# Patient Record
Sex: Female | Born: 1950 | Race: White | Hispanic: No | Marital: Married | Smoking: Never smoker
Health system: Southern US, Community
[De-identification: ages and names within clinical notes are randomized; demographics above are authoritative.]

## PROBLEM LIST (undated history)

## (undated) DIAGNOSIS — K648 Other hemorrhoids: Secondary | ICD-10-CM

## (undated) DIAGNOSIS — H269 Unspecified cataract: Secondary | ICD-10-CM

## (undated) DIAGNOSIS — M069 Rheumatoid arthritis, unspecified: Secondary | ICD-10-CM

## (undated) DIAGNOSIS — R768 Other specified abnormal immunological findings in serum: Secondary | ICD-10-CM

## (undated) DIAGNOSIS — R945 Abnormal results of liver function studies: Secondary | ICD-10-CM

## (undated) DIAGNOSIS — D126 Benign neoplasm of colon, unspecified: Secondary | ICD-10-CM

## (undated) DIAGNOSIS — R7989 Other specified abnormal findings of blood chemistry: Secondary | ICD-10-CM

## (undated) HISTORY — DX: Benign neoplasm of colon, unspecified: D12.6

## (undated) HISTORY — DX: Other hemorrhoids: K64.8

## (undated) HISTORY — DX: Abnormal results of liver function studies: R94.5

## (undated) HISTORY — DX: Rheumatoid arthritis, unspecified: M06.9

## (undated) HISTORY — DX: Other specified abnormal findings of blood chemistry: R79.89

## (undated) HISTORY — DX: Unspecified cataract: H26.9

## (undated) HISTORY — DX: Other specified abnormal immunological findings in serum: R76.8

## (undated) HISTORY — PX: LIVER BIOPSY: SHX301

---

## 1999-08-29 ENCOUNTER — Encounter: Payer: Self-pay | Admitting: Unknown Physician Specialty

## 1999-08-29 ENCOUNTER — Encounter: Admission: RE | Admit: 1999-08-29 | Discharge: 1999-08-29 | Payer: Self-pay | Admitting: Unknown Physician Specialty

## 2000-09-17 ENCOUNTER — Encounter: Payer: Self-pay | Admitting: Obstetrics and Gynecology

## 2000-09-17 ENCOUNTER — Encounter: Admission: RE | Admit: 2000-09-17 | Discharge: 2000-09-17 | Payer: Self-pay | Admitting: Obstetrics and Gynecology

## 2001-09-20 ENCOUNTER — Encounter: Payer: Self-pay | Admitting: Obstetrics and Gynecology

## 2001-09-20 ENCOUNTER — Encounter: Admission: RE | Admit: 2001-09-20 | Discharge: 2001-09-20 | Payer: Self-pay | Admitting: Obstetrics and Gynecology

## 2002-11-01 ENCOUNTER — Encounter: Admission: RE | Admit: 2002-11-01 | Discharge: 2002-11-01 | Payer: Self-pay | Admitting: Obstetrics and Gynecology

## 2002-11-01 ENCOUNTER — Encounter: Payer: Self-pay | Admitting: Obstetrics and Gynecology

## 2008-01-20 ENCOUNTER — Ambulatory Visit: Payer: Self-pay | Admitting: Internal Medicine

## 2008-01-20 DIAGNOSIS — M069 Rheumatoid arthritis, unspecified: Secondary | ICD-10-CM | POA: Insufficient documentation

## 2008-01-20 DIAGNOSIS — J189 Pneumonia, unspecified organism: Secondary | ICD-10-CM | POA: Insufficient documentation

## 2008-02-08 ENCOUNTER — Ambulatory Visit: Payer: Self-pay | Admitting: Internal Medicine

## 2008-02-09 ENCOUNTER — Telehealth: Payer: Self-pay | Admitting: Internal Medicine

## 2018-02-02 ENCOUNTER — Encounter: Payer: Self-pay | Admitting: Gastroenterology

## 2018-02-02 ENCOUNTER — Encounter: Payer: Self-pay | Admitting: Internal Medicine

## 2018-02-10 ENCOUNTER — Ambulatory Visit: Payer: Self-pay | Admitting: Gastroenterology

## 2018-02-17 ENCOUNTER — Encounter: Payer: Self-pay | Admitting: Internal Medicine

## 2018-02-18 ENCOUNTER — Telehealth: Payer: Self-pay | Admitting: Internal Medicine

## 2018-02-18 DIAGNOSIS — R945 Abnormal results of liver function studies: Principal | ICD-10-CM

## 2018-02-18 DIAGNOSIS — R7989 Other specified abnormal findings of blood chemistry: Secondary | ICD-10-CM

## 2018-02-18 NOTE — Telephone Encounter (Signed)
Please see note below. Did you get this pts labs last night? Do you want to see her sooner or schedule her with an app? Please advise.

## 2018-02-21 NOTE — Telephone Encounter (Signed)
Pt with elevated liver enzymes recently.  Last labs 02/15/2018 AST elevated as of 1231 at 72, ALT 130, alkaline phosphatase 150 Previously in May 2019 AST was 28, ALT 30, alkaline phosphatase 89  Please have patient come for the following labs and imaging --CBC, hepatic function panel, INR, basic metabolic panel, ANA, IgG, anti-smooth muscle antibody, antimitochondrial antibody, acute viral hepatitis panel, hepatitis B core total antibody, GGT, ferritin, IBC panel --Please arrange for a complete abdominal ultrasound if not yet performed --Patient also needs to be scheduled to see me in the clinic this week or next

## 2018-02-21 NOTE — Telephone Encounter (Signed)
Patient has already had ultrasound completed at [facility] and has had acute viral studies as well as a CMP, therefore we will not repeat ultrasound or viral hep studies. Dr Hilarie Fredrickson indicates that we can repeat hepatic function test and other appropriate labs minus viral hep studies. Adjustments have been made to orders and patient advised. She will come for labs this week. Patient will come for office visit on 02/28/2018 at 945 am.

## 2018-02-22 ENCOUNTER — Other Ambulatory Visit (INDEPENDENT_AMBULATORY_CARE_PROVIDER_SITE_OTHER): Payer: Self-pay

## 2018-02-22 ENCOUNTER — Encounter (INDEPENDENT_AMBULATORY_CARE_PROVIDER_SITE_OTHER): Payer: Self-pay

## 2018-02-22 DIAGNOSIS — R945 Abnormal results of liver function studies: Secondary | ICD-10-CM

## 2018-02-22 DIAGNOSIS — R7989 Other specified abnormal findings of blood chemistry: Secondary | ICD-10-CM

## 2018-02-22 LAB — CBC WITH DIFFERENTIAL/PLATELET
Basophils Absolute: 0 10*3/uL (ref 0.0–0.1)
Basophils Relative: 0.6 % (ref 0.0–3.0)
EOS PCT: 2.2 % (ref 0.0–5.0)
Eosinophils Absolute: 0.1 10*3/uL (ref 0.0–0.7)
HEMATOCRIT: 36.4 % (ref 36.0–46.0)
Hemoglobin: 12.3 g/dL (ref 12.0–15.0)
Lymphocytes Relative: 46.5 % — ABNORMAL HIGH (ref 12.0–46.0)
Lymphs Abs: 2.9 10*3/uL (ref 0.7–4.0)
MCHC: 33.8 g/dL (ref 30.0–36.0)
MCV: 95.3 fl (ref 78.0–100.0)
Monocytes Absolute: 0.5 10*3/uL (ref 0.1–1.0)
Monocytes Relative: 7.9 % (ref 3.0–12.0)
Neutro Abs: 2.7 10*3/uL (ref 1.4–7.7)
Neutrophils Relative %: 42.8 % — ABNORMAL LOW (ref 43.0–77.0)
Platelets: 249 10*3/uL (ref 150.0–400.0)
RBC: 3.82 Mil/uL — AB (ref 3.87–5.11)
RDW: 12.4 % (ref 11.5–15.5)
WBC: 6.3 10*3/uL (ref 4.0–10.5)

## 2018-02-22 LAB — IBC PANEL
Iron: 112 ug/dL (ref 42–145)
Saturation Ratios: 39.6 % (ref 20.0–50.0)
Transferrin: 202 mg/dL — ABNORMAL LOW (ref 212.0–360.0)

## 2018-02-22 LAB — PROTIME-INR
INR: 0.9 ratio (ref 0.8–1.0)
Prothrombin Time: 11.1 s (ref 9.6–13.1)

## 2018-02-22 LAB — HEPATIC FUNCTION PANEL
ALT: 109 U/L — ABNORMAL HIGH (ref 0–35)
AST: 84 U/L — ABNORMAL HIGH (ref 0–37)
Albumin: 4.3 g/dL (ref 3.5–5.2)
Alkaline Phosphatase: 142 U/L — ABNORMAL HIGH (ref 39–117)
Bilirubin, Direct: 0.1 mg/dL (ref 0.0–0.3)
Total Bilirubin: 0.5 mg/dL (ref 0.2–1.2)
Total Protein: 6.7 g/dL (ref 6.0–8.3)

## 2018-02-22 LAB — FERRITIN: FERRITIN: 107.6 ng/mL (ref 10.0–291.0)

## 2018-02-22 LAB — GAMMA GT: GGT: 176 U/L — ABNORMAL HIGH (ref 7–51)

## 2018-02-23 ENCOUNTER — Encounter: Payer: Self-pay | Admitting: *Deleted

## 2018-02-25 LAB — HEPATITIS B SURFACE ANTIBODY,QUALITATIVE: Hep B S Ab: NONREACTIVE

## 2018-02-25 LAB — ANTI-SMOOTH MUSCLE ANTIBODY, IGG: Actin (Smooth Muscle) Antibody (IGG): <20 U (ref <20)

## 2018-02-25 LAB — ANA: Anti Nuclear Antibody(ANA): NEGATIVE

## 2018-02-25 LAB — IGG: IgG (Immunoglobin G), Serum: 815 mg/dL (ref 600–1540)

## 2018-02-25 LAB — HEPATITIS A ANTIBODY, TOTAL: Hepatitis A AB,Total: NONREACTIVE

## 2018-02-25 LAB — MITOCHONDRIAL ANTIBODIES: Mitochondrial M2 Ab, IgG: <=20 U

## 2018-02-28 ENCOUNTER — Encounter: Payer: Self-pay | Admitting: Internal Medicine

## 2018-02-28 ENCOUNTER — Ambulatory Visit: Payer: Medicare Other | Admitting: Internal Medicine

## 2018-02-28 ENCOUNTER — Other Ambulatory Visit (INDEPENDENT_AMBULATORY_CARE_PROVIDER_SITE_OTHER): Payer: Medicare Other

## 2018-02-28 VITALS — BP 100/60 | HR 68 | Ht 64.0 in | Wt 141.0 lb

## 2018-02-28 DIAGNOSIS — R945 Abnormal results of liver function studies: Secondary | ICD-10-CM | POA: Diagnosis not present

## 2018-02-28 DIAGNOSIS — R7989 Other specified abnormal findings of blood chemistry: Secondary | ICD-10-CM

## 2018-02-28 LAB — IGA: IgA: 261 mg/dL (ref 68–378)

## 2018-02-28 LAB — CK: Total CK: 68 U/L (ref 7–177)

## 2018-02-28 LAB — CORTISOL: Cortisol, Plasma: 7.9 ug/dL

## 2018-02-28 MED ORDER — CLOTRIMAZOLE-BETAMETHASONE 1-0.05 % EX CREA: 1.0000 "application " | TOPICAL_CREAM | Freq: Two times a day (BID) | CUTANEOUS | 0 refills | Status: AC

## 2018-02-28 NOTE — Progress Notes (Signed)
Patient ID: ALEXSYS ESKIN, female   DOB: 1950/12/12, 68 y.o.   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 Amil Amen and Dr. Lin Landsman to evaluate elevated liver enzymes.  She is here alone today.  She reports she is feeling great, but is worried about her elevated liver enzymes.  She has had no GI symptoms including nausea, vomiting, heartburn, trouble swallowing.  No abdominal pain.  No change in bowel habits.  No diarrhea or constipation.  No jaundice, itching.  No abdominal or lower extremity swelling.  There is been no change in medicine.  She has been taking Orencia for 6 to 8 years.  She was previously taking a multivitamin but has stopped this.  No other medicines, over-the-counter supplements or herbals.  She took Remicade many years ago but stopped after she developed pneumonia.  In May 2019 her liver enzymes were normal and the initial elevated liver enzymes were from October 2019.  They have been persistently elevated when rechecked in November, December and January 2020.    She does not use tobacco or drink alcohol.  She did previously drink alcohol on occasion but stopped when her liver enzymes were elevated.  No family history of liver disease.  Incidental history she provides included a scaly red rash on her neck which occurred in the summer 2019.  This resolved.  She will occasionally get flaky irritation above her eyes which is treated with a cream.  She also developed plantar fasciitis over the summer but this is also improved with shoe inserts.  She remains very active walking for 5 miles daily and playing tennis frequently.  She had a colonoscopy in June 2016 in [place] where a small sessile serrated polyp was removed.  She had an ultrasound of the right upper quadrant performed in [place] on 01/24/2018.  This was normal.  Gallbladder was  normal.  Bile duct was 3 mm.  Liver was normal in echotexture and Doppler blood flow imaging was normal.  Past Medical History:  Diagnosis Date  . Elevated LFTs   . Internal hemorrhoids   . Rheumatoid arthritis ([place])   . Rheumatoid factor positive   . Serrated adenoma of colon     History reviewed. No pertinent surgical history.  Outpatient Medications Prior to Visit  Medication Sig Dispense Refill  . abatacept (ORENCIA) 250 MG injection Inject into the vein every 30 (thirty) days.    . Multiple Vitamin (MULTIVITAMIN) capsule Take by mouth.     No facility-administered medications prior to visit.     Allergies  Allergen Reactions  . Codeine     REACTION: GI upset    Family History  Problem Relation Age of Onset  . Polymyalgia rheumatica Mother   . Osteoarthritis Mother   . Heart disease Father   . Cancer Maternal Grandfather     Social History   Tobacco Use  . Smoking status: Never Smoker  . Smokeless tobacco: Never Used  Substance Use Topics  . Alcohol use: Yes    Comment: rare  . Drug use: Never    ROS: As per history of present illness, otherwise negative  BP 100/60   Pulse 68   Ht 5\' 4"  (1.626 m)   Wt 141 lb (64 kg)   BMI 24.20 kg/m  Constitutional: Well-developed and well-nourished. No distress. HEENT: Normocephalic and atraumatic.  Conjunctivae are normal.  No scleral icterus. Neck: Neck supple. Trachea midline. Cardiovascular: Normal rate, regular rhythm and intact distal pulses. No M/R/G Pulmonary/chest: Effort normal and breath sounds normal. No wheezing, rales or rhonchi. Abdominal: Soft, nontender, nondistended. Bowel sounds active throughout. There are no masses palpable. No hepatosplenomegaly. Extremities: no clubbing, cyanosis, or edema Neurological: Alert and oriented to person place and time. Skin: Skin is warm and dry.  There is a red rash at her bra line under her right breast which appears fungal in nature Psychiatric: Normal mood and  affect. Behavior is normal.  RELEVANT LABS AND IMAGING: CBC    Component Value Date/Time   WBC 6.3 02/22/2018 1116   RBC 3.82 (L) 02/22/2018 1116   HGB 12.3 02/22/2018 1116   HCT 36.4 02/22/2018 1116   PLT 249.0 02/22/2018 1116   MCV 95.3 02/22/2018 1116   MCHC 33.8 02/22/2018 1116   RDW 12.4 02/22/2018 1116   LYMPHSABS 2.9 02/22/2018 1116   MONOABS 0.5 02/22/2018 1116   EOSABS 0.1 02/22/2018 1116   BASOSABS 0.0 02/22/2018 1116    CMP     Component Value Date/Time   PROT 6.7 02/22/2018 1116   ALBUMIN 4.3 02/22/2018 1116   AST 84 (H) 02/22/2018 1116   ALT 109 (H) 02/22/2018 1116   ALKPHOS 142 (H) 02/22/2018 1116   BILITOT 0.5 02/22/2018 1116   [place] -- see HPI  Acute viral panel negative, hepatitis C antibody negative  Labs ordered by me before this visit Iron normal IgG normal ANA negative INR 0.9 GGT elevated at 176 Hepatitis B surface antibody negative Hepatitis A negative Antimitochondrial antibody negative Anti-smooth muscle antibody negative   ASSESSMENT/PLAN: 68 year old female with PMH of 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 Amil Amen and Dr. Lin Landsman to evaluate elevated liver enzymes.   1. Elevated liver enzymes --I have reviewed her labs and imaging with her today.  There is absolutely no evidence of liver failure or advanced liver disease.  Her liver ultrasound was also reassuring.  Initially, I was suspicious about a possible autoimmune overlap given her history of rheumatoid arthritis but her autoimmune markers to this point have been completely negative.  Medication related liver enzyme abnormality was considered but Orencia does not have elevated liver enzymes listed as a possible complication, she is also been on this medication for many years without issue to this point.  I have recommended some additional test and imaging as follows --Hepatitis B surface  antigen --Anti-liver kidney microsomal antibody --Fractionate alkaline phosphatase though this is likely liver in origin --Ceruloplasmin --Celiac panel --Cortisol --Creatinine kinase --Proceed with MRI of the abdomen with contrast plus MRCP  If the above work-up is also unrevealing I would recommend liver biopsy.  We discussed this today.  If after liver biopsy we do not have a clear diagnosis I would consider sending her to Dr. Charlean Sanfilippo for his opinion.  2.  History of sessile serrated polyp --surveillance colonoscopy June 2021  Cc: Dr. Lovette Cliche and Dr. Leigh Aurora

## 2018-02-28 NOTE — Patient Instructions (Signed)
If you are age 70 or older, your body mass index should be between 23-30. Your Body mass index is 24.2 kg/m. If this is out of the aforementioned range listed, please consider follow up with your Primary Care Provider.  If you are age 47 or younger, your body mass index should be between 19-25. Your Body mass index is 24.2 kg/m. If this is out of the aformentioned range listed, please consider follow up with your Primary Care Provider.   You have been scheduled for an MRI/MRCP at [facility] 03/04/18. Your appointment time is 3:00pm. Please arrive 30 minutes prior to your appointment time for registration purposes. Please make certain not to have anything to eat or drink 6 hours prior to your test. In addition, if you have any metal in your body, have a pacemaker or defibrillator, please be sure to let your ordering physician know. This test typically takes 45 minutes to 1 hour to complete. Should you need to reschedule, please call [phone] to do so.  Your provider has requested that you go to the basement level for lab work before leaving today. Press "B" on the elevator. The lab is located at the first door on the left as you exit the elevator.

## 2018-03-03 LAB — TISSUE TRANSGLUTAMINASE, IGA: (tTG) Ab, IgA: 1 U/mL

## 2018-03-03 LAB — ALKALINE PHOSPHATASE: Alkaline phosphatase (APISO): 163 U/L — ABNORMAL HIGH (ref 33–130)

## 2018-03-03 LAB — HEPATITIS B CORE ANTIBODY, TOTAL: Hep B Core Total Ab: NONREACTIVE

## 2018-03-03 LAB — HEPATITIS B SURFACE ANTIGEN: Hepatitis B Surface Ag: NONREACTIVE

## 2018-03-03 LAB — ALPHA-1-ANTITRYPSIN: A-1 Antitrypsin, Ser: 138 mg/dL (ref 83–199)

## 2018-03-03 LAB — ANTI-MICROSOMAL ANTIBODY LIVER / KIDNEY: LKM1 Ab: <=20 U (ref <=20.0)

## 2018-03-03 LAB — CERULOPLASMIN: Ceruloplasmin: 30 mg/dL (ref 18–53)

## 2018-03-04 ENCOUNTER — Other Ambulatory Visit: Payer: Self-pay | Admitting: Internal Medicine

## 2018-03-04 ENCOUNTER — Telehealth: Payer: Self-pay | Admitting: Internal Medicine

## 2018-03-04 ENCOUNTER — Ambulatory Visit (HOSPITAL_COMMUNITY)
Admission: RE | Admit: 2018-03-04 | Discharge: 2018-03-04 | Disposition: A | Discharge status: Home / Self Care | Payer: Medicare Other | Source: Ambulatory Visit | Attending: Internal Medicine | Admitting: Internal Medicine

## 2018-03-04 DIAGNOSIS — R945 Abnormal results of liver function studies: Principal | ICD-10-CM

## 2018-03-04 DIAGNOSIS — R7989 Other specified abnormal findings of blood chemistry: Secondary | ICD-10-CM

## 2018-03-04 LAB — CREATININE, SERUM
Creatinine, Ser: 0.96 mg/dL (ref 0.44–1.00)
GFR calc Af Amer: >60 mL/min (ref >60)
GFR calc non Af Amer: >60 mL/min (ref >60)

## 2018-03-04 MED ORDER — GADOBUTROL 1 MMOL/ML IV SOLN
6.0000 mL | Freq: Once | INTRAVENOUS | Status: AC | PRN
  Administered 2018-03-04: 6 mL via INTRAVENOUS

## 2018-03-04 NOTE — Telephone Encounter (Signed)
Correct order is in epic.

## 2018-03-04 NOTE — Telephone Encounter (Signed)
Darlene Martin from [facility] imaging calling, pt has appt this afternoon at 3pm, orders sent for MRI abd and MRCP needs to be w/w.o contrast, code [id number] [id number]. Order sent was without contrast only. Pls fax new order.

## 2018-03-08 ENCOUNTER — Ambulatory Visit: Payer: Self-pay | Admitting: Internal Medicine

## 2018-03-08 ENCOUNTER — Telehealth: Payer: Self-pay | Admitting: *Deleted

## 2018-03-08 DIAGNOSIS — R748 Abnormal levels of other serum enzymes: Secondary | ICD-10-CM

## 2018-03-08 NOTE — Telephone Encounter (Signed)
Orders placed for liver biopsy. Carrie in radiology scheduling indicates that they will make sure this gets scheduled.

## 2018-03-08 NOTE — Telephone Encounter (Signed)
-----   Message from Jerene Bears, MD sent at 03/07/2018  5:14 PM EST ----- Regarding: Schedule appt Please refer patient for ultrasound guided liver biopsy with radiology  Indication is elevated liver enzymes --rule out autoimmune hepatitis  Thanks JMP

## 2018-03-17 ENCOUNTER — Other Ambulatory Visit: Payer: Self-pay | Admitting: Student

## 2018-03-18 ENCOUNTER — Ambulatory Visit (HOSPITAL_COMMUNITY)
Admission: RE | Admit: 2018-03-18 | Discharge: 2018-03-18 | Disposition: A | Discharge status: Home / Self Care | Payer: Medicare Other | Source: Ambulatory Visit | Attending: Internal Medicine | Admitting: Internal Medicine

## 2018-03-18 ENCOUNTER — Encounter (HOSPITAL_COMMUNITY): Payer: Self-pay

## 2018-03-18 DIAGNOSIS — Z8249 Family history of ischemic heart disease and other diseases of the circulatory system: Secondary | ICD-10-CM | POA: Diagnosis not present

## 2018-03-18 DIAGNOSIS — M059 Rheumatoid arthritis with rheumatoid factor, unspecified: Secondary | ICD-10-CM | POA: Diagnosis not present

## 2018-03-18 DIAGNOSIS — Z885 Allergy status to narcotic agent status: Secondary | ICD-10-CM | POA: Diagnosis not present

## 2018-03-18 DIAGNOSIS — R7989 Other specified abnormal findings of blood chemistry: Secondary | ICD-10-CM | POA: Insufficient documentation

## 2018-03-18 DIAGNOSIS — R748 Abnormal levels of other serum enzymes: Secondary | ICD-10-CM | POA: Insufficient documentation

## 2018-03-18 DIAGNOSIS — Z8261 Family history of arthritis: Secondary | ICD-10-CM | POA: Diagnosis not present

## 2018-03-18 DIAGNOSIS — Z79899 Other long term (current) drug therapy: Secondary | ICD-10-CM | POA: Insufficient documentation

## 2018-03-18 LAB — PROTIME-INR
INR: 0.85
PROTHROMBIN TIME: 11.5 s (ref 11.4–15.2)

## 2018-03-18 LAB — CBC
HCT: 40 % (ref 36.0–46.0)
Hemoglobin: 12.7 g/dL (ref 12.0–15.0)
MCH: 31.2 pg (ref 26.0–34.0)
MCHC: 31.8 g/dL (ref 30.0–36.0)
MCV: 98.3 fL (ref 80.0–100.0)
Platelets: 278 10*3/uL (ref 150–400)
RBC: 4.07 MIL/uL (ref 3.87–5.11)
RDW: 12.3 % (ref 11.5–15.5)
WBC: 7.3 10*3/uL (ref 4.0–10.5)
nRBC: 0 % (ref 0.0–0.2)

## 2018-03-18 LAB — APTT: aPTT: 27 seconds (ref 24–36)

## 2018-03-18 MED ORDER — LIDOCAINE HCL 1 % IJ SOLN
INTRAMUSCULAR | Status: AC | PRN
  Administered 2018-03-18: 10 mL

## 2018-03-18 MED ORDER — LIDOCAINE HCL 1 % IJ SOLN
INTRAMUSCULAR | Status: AC
  Filled 2018-03-18: qty 20

## 2018-03-18 MED ORDER — FENTANYL CITRATE (PF) 100 MCG/2ML IJ SOLN
INTRAMUSCULAR | Status: AC | PRN
  Administered 2018-03-18 (×2): 50 ug via INTRAVENOUS

## 2018-03-18 MED ORDER — MIDAZOLAM HCL 2 MG/2ML IJ SOLN
INTRAMUSCULAR | Status: AC | PRN
  Administered 2018-03-18 (×3): 1 mg via INTRAVENOUS

## 2018-03-18 MED ORDER — FENTANYL CITRATE (PF) 100 MCG/2ML IJ SOLN
INTRAMUSCULAR | Status: AC
  Filled 2018-03-18: qty 2

## 2018-03-18 MED ORDER — SODIUM CHLORIDE 0.9 % IV SOLN
INTRAVENOUS | Status: DC
  Administered 2018-03-18: 12:00:00 via INTRAVENOUS

## 2018-03-18 MED ORDER — GELATIN ABSORBABLE 12-7 MM EX MISC
CUTANEOUS | Status: AC
  Filled 2018-03-18: qty 1

## 2018-03-18 MED ORDER — MIDAZOLAM HCL 2 MG/2ML IJ SOLN
INTRAMUSCULAR | Status: AC
  Filled 2018-03-18: qty 4

## 2018-03-18 NOTE — Discharge Instructions (Signed)
Liver Biopsy, Care After °These instructions give you information on caring for yourself after your procedure. Your doctor may also give you more specific instructions. Call your doctor if you have any problems or questions after your procedure. °What can I expect after the procedure? °After the procedure, it is common to have: °· Pain and soreness where the biopsy was done. °· Bruising around the area where the biopsy was done. °· Sleepiness and be tired for a few days. °Follow these instructions at home: °Medicines °· Take over-the-counter and prescription medicines only as told by your doctor. °· If you were prescribed an antibiotic medicine, take it as told by your doctor. Do not stop taking the antibiotic even if you start to feel better. °· Do not take medicines such as aspirin and ibuprofen. These medicines can thin your blood. Do not take these medicines unless your doctor tells you to take them. °· If you are taking prescription pain medicine, take actions to prevent or treat constipation. Your doctor may recommend that you: °? Drink enough fluid to keep your pee (urine) clear or pale yellow. °? Take over-the-counter or prescription medicines. °? Eat foods that are high in fiber, such as fresh fruits and vegetables, whole grains, and beans. °? Limit foods that are high in fat and processed sugars, such as fried and sweet foods. °Caring for your cut °· Follow instructions from your doctor about how to take care of your cuts from surgery (incisions). Make sure you: °? Wash your hands with soap and water before you change your bandage (dressing). If you cannot use soap and water, use hand sanitizer. °? Change your bandage as told by your doctor. °? Leave stitches (sutures), skin glue, or skin tape (adhesive) strips in place. They may need to stay in place for 2 weeks or longer. If tape strips get loose and curl up, you may trim the loose edges. Do not remove tape strips completely unless your doctor says it is  okay. °· Check your cuts every day for signs of infection. Check for: °? Redness, swelling, or more pain. °? Fluid or blood. °? Pus or a bad smell. °? Warmth. °· Do not take baths, swim, or use a hot tub until your doctor says it is okay to do so. °Activity ° °· Rest at home for 1-2 days or as told by your doctor. °? Avoid sitting for a long time without moving. Get up to take short walks every 1-2 hours. °· Return to your normal activities as told by your doctor. Ask what activities are safe for you. °· Do not do these things in the first 24 hours: °? Drive. °? Use machinery. °? Take a bath or shower. °· Do not lift more than 10 pounds (4.5 kg) or play contact sports for the first 2 weeks. °General instructions ° °· Do not drink alcohol in the first week after the procedure. °· Have someone stay with you for at least 24 hours after the procedure. °· Get your test results. Ask your doctor or the department that is doing the test: °? When will my results be ready? °? How will I get my results? °? What are my treatment options? °? What other tests do I need? °? What are my next steps? °· Keep all follow-up visits as told by your doctor. This is important. °Contact a doctor if: °· A cut bleeds and leaves more than just a small spot of blood. °· A cut is red, puffs up (  swells), or hurts more than before. °· Fluid or something else comes from a cut. °· A cut smells bad. °· You have a fever or chills. °Get help right away if: °· You have swelling, bloating, or pain in your belly (abdomen). °· You get dizzy or faint. °· You have a rash. °· You feel sick to your stomach (nauseous) or throw up (vomit). °· You have trouble breathing, feel short of breath, or feel faint. °· Your chest hurts. °· You have problems talking or seeing. °· You have trouble with your balance or moving your arms or legs. °Summary °· After the procedure, it is common to have pain, soreness, bruising, and tiredness. °· Your doctor will tell you how to  take care of yourself at home. Change your bandage, take your medicines, and limit your activities as told by your doctor. °· Call your doctor if you have symptoms of infection. Get help right away if your belly swells, your cut bleeds a lot, or you have trouble talking or breathing. °This information is not intended to replace advice given to you by your health care provider. Make sure you discuss any questions you have with your health care provider. °Document Released: 11/12/2007 Document Revised: 02/12/2017 Document Reviewed: 02/12/2017 °Elsevier Interactive Patient Education © 2019 [employer] °Moderate Conscious Sedation, Adult, Care After °These instructions provide you with information about caring for yourself after your procedure. Your health care provider may also give you more specific instructions. Your treatment has been planned according to current medical practices, but problems sometimes occur. Call your health care provider if you have any problems or questions after your procedure. °What can I expect after the procedure? °After your procedure, it is common: °· To feel sleepy for several hours. °· To feel clumsy and have poor balance for several hours. °· To have poor judgment for several hours. °· To vomit if you eat too soon. °Follow these instructions at home: °For at least 24 hours after the procedure: ° °· Do not: °? Participate in activities where you could fall or become injured. °? Drive. °? Use heavy machinery. °? Drink alcohol. °? Take sleeping pills or medicines that cause drowsiness. °? Make important decisions or sign legal documents. °? Take care of children on your own. °· Rest. °Eating and drinking °· Follow the diet recommended by your health care provider. °· If you vomit: °? Drink water, juice, or soup when you can drink without vomiting. °? Make sure you have little or no nausea before eating solid foods. °General instructions °· Have a responsible adult stay with you until  you are awake and alert. °· Take over-the-counter and prescription medicines only as told by your health care provider. °· If you smoke, do not smoke without supervision. °· Keep all follow-up visits as told by your health care provider. This is important. °Contact a health care provider if: °· You keep feeling nauseous or you keep vomiting. °· You feel light-headed. °· You develop a rash. °· You have a fever. °Get help right away if: °· You have trouble breathing. °This information is not intended to replace advice given to you by your health care provider. Make sure you discuss any questions you have with your health care provider. °Document Released: 11/23/2012 Document Revised: 07/08/2015 Document Reviewed: 05/25/2015 °Elsevier Interactive Patient Education © 2019 Elsevier Inc. ° °

## 2018-03-18 NOTE — Procedures (Signed)
Interventional Radiology Procedure:   Indications: Elevated liver enzymes  Procedure: US guided liver biopsy  Findings: 3 cores from right lobe  Complications: None     EBL: Minimal  Plan: Bedrest 3 hours.    Sylvestre Rathgeber R. Anselm Pancoast, MD  Pager: [phone]

## 2018-03-18 NOTE — H&P (Signed)
Chief Complaint: Patient was seen in consultation today for random liver biopsy at the request of Pyrtle,Jay M  Referring Physician(s): Pyrtle,Jay M  Supervising Physician: Markus Daft  Patient Status: [facility] - Out-pt  History of Present Illness: Darlene Martin is a 68 y.o. female being worked up for elevated liver enzymes. She is referred for random liver biopsy. PMHx, meds, labs, imaging, allergies reviewed. Feels well, no recent fevers, chills, illness. Has been NPO today as directed. Family at bedside.   Past Medical History:  Diagnosis Date  . Elevated LFTs   . Internal hemorrhoids   . Rheumatoid arthritis ([place])   . Rheumatoid factor positive   . Serrated adenoma of colon     History reviewed. No pertinent surgical history.  Allergies: Codeine  Medications: Prior to Admission medications   Medication Sig Start Date End Date Taking? Authorizing Provider  clotrimazole-betamethasone (LOTRISONE) cream Apply 1 application topically 2 (two) times daily. To affected area 02/28/18  Yes Pyrtle, Lajuan Lines, MD  abatacept (ORENCIA) 250 MG injection Inject into the vein every 30 (thirty) days.    [provider]  Multiple Vitamin (MULTIVITAMIN) capsule Take by mouth.    [provider]     Family History  Problem Relation Age of Onset  . Polymyalgia rheumatica Mother   . Osteoarthritis Mother   . Heart disease Father   . Cancer Maternal Grandfather     Social History   Socioeconomic History  . Marital status: Married    Spouse name: Not on file  . Number of children: Not on file  . Years of education: Not on file  . Highest education level: Not on file  Occupational History  . Occupation: retired    Comment: Pharmacist, hospital  Social Needs  . Financial resource strain: Not on file  . Food insecurity:    Worry: Not on file    Inability: Not on file  . Transportation needs:    Medical: Not on file    Non-medical: Not on file  Tobacco Use  . Smoking  status: Never Smoker  . Smokeless tobacco: Never Used  Substance and Sexual Activity  . Alcohol use: Yes    Comment: rare  . Drug use: Never  . Sexual activity: Not on file  Lifestyle  . Physical activity:    Days per week: Not on file    Minutes per session: Not on file  . Stress: Not on file  Relationships  . Social connections:    Talks on phone: Not on file    Gets together: Not on file    Attends religious service: Not on file    Active member of club or organization: Not on file    Attends meetings of clubs or organizations: Not on file    Relationship status: Not on file  Other Topics Concern  . Not on file  Social History Narrative  . Not on file     Review of Systems: A 12 point ROS discussed and pertinent positives are indicated in the HPI above.  All other systems are negative.  Review of Systems  Vital Signs: BP 119/75 (BP Location: Right Arm)   Pulse 70   Temp 98.2 F (36.8 C) (Oral)   Resp 16   Ht 5\' 4"  (1.626 m)   Wt 64 kg   SpO2 98%   BMI 24.20 kg/m   Physical Exam Constitutional:      Appearance: Normal appearance.  HENT:     Mouth/Throat:  Mouth: Mucous membranes are moist.     Pharynx: Oropharynx is clear.  Cardiovascular:     Rate and Rhythm: Normal rate and regular rhythm.     Heart sounds: Normal heart sounds.  Pulmonary:     Effort: Pulmonary effort is normal. No respiratory distress.     Breath sounds: Normal breath sounds.  Skin:    General: Skin is warm and dry.  Neurological:     General: No focal deficit present.     Mental Status: She is alert and oriented to person, place, and time.  Psychiatric:        Mood and Affect: Mood normal.        Judgment: Judgment normal.     Imaging: Mr Abdomen Mrcp W Wo Contast  Result Date: 03/04/2018 CLINICAL DATA:  Elevated liver enzymes. EXAM: MRI ABDOMEN WITHOUT AND WITH CONTRAST (INCLUDING MRCP) TECHNIQUE: Multiplanar multisequence MR imaging of the abdomen was performed both  before and after the administration of intravenous contrast. Heavily T2-weighted images of the biliary and pancreatic ducts were obtained, and three-dimensional MRCP images were rendered by post processing. CONTRAST:  6 mL Gadavist COMPARISON:  Ultrasound 01/25/2018 FINDINGS: Lower chest:  Lung bases clear Hepatobiliary: Normal liver parenchymal intensity. No hepatic steatosis. No focal hepatic lesion. No intrahepatic biliary duct dilatation. Gallbladder is normal. Common bile duct normal. Postcontrast imaging demonstrates normal enhancement of liver parenchyma. No focal lesion. Pancreas: Normal pancreatic parenchymal intensity. No ductal dilatation or inflammation. Spleen: Normal spleen. Adrenals/urinary tract: Adrenal glands and kidneys are normal. Stomach/Bowel: Stomach and limited of the small bowel is unremarkable Vascular/Lymphatic: Abdominal aortic normal caliber. No retroperitoneal periportal lymphadenopathy. Musculoskeletal: No aggressive osseous lesion IMPRESSION: 1. Normal liver parenchyma. 2. Normal biliary tree. 3. Normal pancreas. Electronically Signed   By: Suzy Bouchard M.D.   On: 03/04/2018 17:17    Labs:  CBC: Recent Labs    02/22/18 1116 03/18/18 1131  WBC 6.3 7.3  HGB 12.3 12.7  HCT 36.4 40.0  PLT 249.0 278    COAGS: Recent Labs    02/22/18 1116 03/18/18 1131  INR 0.9 0.85  APTT  --  27    BMP: Recent Labs    03/04/18 1523  CREATININE 0.96  GFRNONAA >60  GFRAA >60    LIVER FUNCTION TESTS: Recent Labs    02/22/18 1116  BILITOT 0.5  AST 84*  ALT 109*  ALKPHOS 142*  PROT 6.7  ALBUMIN 4.3    TUMOR MARKERS: No results for input(s): AFPTM, CEA, CA199, CHROMGRNA in the last 8760 hours.  Assessment and Plan: Elevated LFTs. For US guided random liver bx Labs ok Risks and benefits discussed with the patient including, but not limited to bleeding, infection, damage to adjacent structures or low yield requiring additional tests.  All of the patient's  questions were answered, patient is agreeable to proceed. Consent signed and in chart.    Thank you for this interesting consult.  I greatly enjoyed meeting Darlene Martin and look forward to participating in their care.  A copy of this report was sent to the requesting provider on this date.  Electronically Signed: Ascencion Dike, PA-C 03/18/2018, 12:05 PM   I spent a total of 20 minutes in face to face in clinical consultation, greater than 50% of which was counseling/coordinating care for liver bx

## 2018-03-22 ENCOUNTER — Other Ambulatory Visit: Payer: Self-pay

## 2018-03-22 DIAGNOSIS — R7989 Other specified abnormal findings of blood chemistry: Secondary | ICD-10-CM

## 2018-03-22 DIAGNOSIS — R945 Abnormal results of liver function studies: Principal | ICD-10-CM

## 2018-05-11 ENCOUNTER — Encounter: Payer: Self-pay | Admitting: Internal Medicine

## 2019-07-20 DIAGNOSIS — M0589 Other rheumatoid arthritis with rheumatoid factor of multiple sites: Secondary | ICD-10-CM | POA: Diagnosis not present

## 2019-08-02 DIAGNOSIS — Z6823 Body mass index (BMI) 23.0-23.9, adult: Secondary | ICD-10-CM | POA: Diagnosis not present

## 2019-08-02 DIAGNOSIS — R1312 Dysphagia, oropharyngeal phase: Secondary | ICD-10-CM | POA: Diagnosis not present

## 2019-08-02 DIAGNOSIS — M0579 Rheumatoid arthritis with rheumatoid factor of multiple sites without organ or systems involvement: Secondary | ICD-10-CM | POA: Diagnosis not present

## 2019-08-02 DIAGNOSIS — Z20828 Contact with and (suspected) exposure to other viral communicable diseases: Secondary | ICD-10-CM | POA: Diagnosis not present

## 2019-08-02 DIAGNOSIS — R7989 Other specified abnormal findings of blood chemistry: Secondary | ICD-10-CM | POA: Diagnosis not present

## 2019-08-23 DIAGNOSIS — M0589 Other rheumatoid arthritis with rheumatoid factor of multiple sites: Secondary | ICD-10-CM | POA: Diagnosis not present

## 2019-09-01 DIAGNOSIS — M7989 Other specified soft tissue disorders: Secondary | ICD-10-CM | POA: Diagnosis not present

## 2019-09-01 DIAGNOSIS — R600 Localized edema: Secondary | ICD-10-CM | POA: Diagnosis not present

## 2019-09-01 DIAGNOSIS — M1990 Unspecified osteoarthritis, unspecified site: Secondary | ICD-10-CM | POA: Diagnosis not present

## 2019-09-01 DIAGNOSIS — Z6823 Body mass index (BMI) 23.0-23.9, adult: Secondary | ICD-10-CM | POA: Diagnosis not present

## 2019-09-11 DIAGNOSIS — M7122 Synovial cyst of popliteal space [Baker], left knee: Secondary | ICD-10-CM | POA: Diagnosis not present

## 2019-09-11 DIAGNOSIS — M069 Rheumatoid arthritis, unspecified: Secondary | ICD-10-CM | POA: Diagnosis not present

## 2019-09-15 DIAGNOSIS — M7122 Synovial cyst of popliteal space [Baker], left knee: Secondary | ICD-10-CM | POA: Diagnosis not present

## 2019-09-15 DIAGNOSIS — M069 Rheumatoid arthritis, unspecified: Secondary | ICD-10-CM | POA: Diagnosis not present

## 2019-09-25 DIAGNOSIS — M0589 Other rheumatoid arthritis with rheumatoid factor of multiple sites: Secondary | ICD-10-CM | POA: Diagnosis not present

## 2019-10-07 IMAGING — MR MR ABDOMEN WO/W CM MRCP
11 of 19 series · 21 of 48 positions shown · IV contrast (gadavist)
Comparison: Ultrasound 01/25/2018

CLINICAL DATA: Elevated liver enzymes.

EXAM:
MRI ABDOMEN WITHOUT AND WITH CONTRAST (INCLUDING MRCP)
TECHNIQUE: Multiplanar multisequence MR imaging of the abdomen was performed
both before and after the administration of intravenous contrast.
Heavily T2-weighted images of the biliary and pancreatic ducts were
obtained, and three-dimensional MRCP images were rendered by post
processing.
CONTRAST:  6 mL Gadavist

[Series 3: T2 fat-sat · axial · 5.0mm · 0.78mm/px · 1 of 33 slices shown]
[im 1/33]
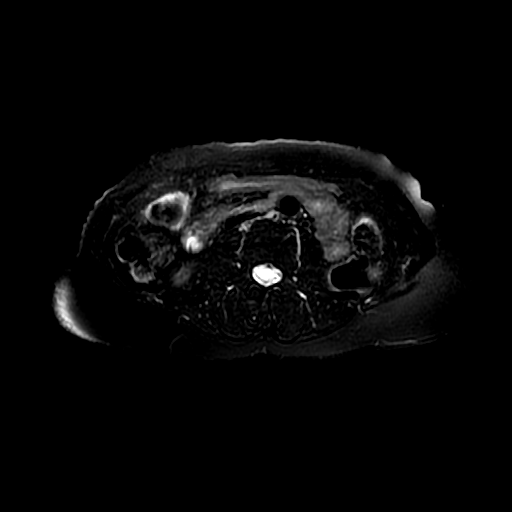

[Series 4: T2 · axial · 5.0mm · 0.78mm/px · z∈[-83,+112]mm · 2 of 40 slices shown (1 of 2)]
[im 1/40]
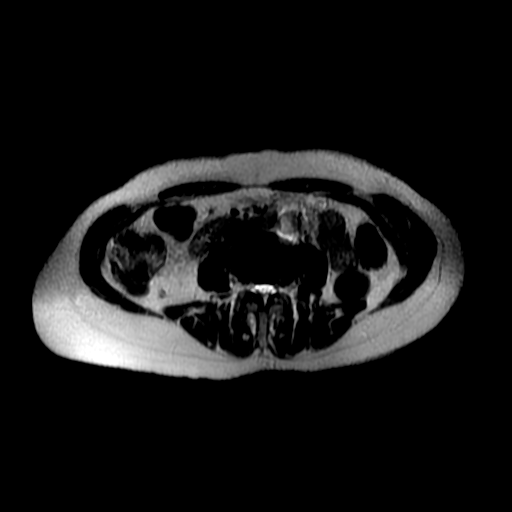
[im 40/40]
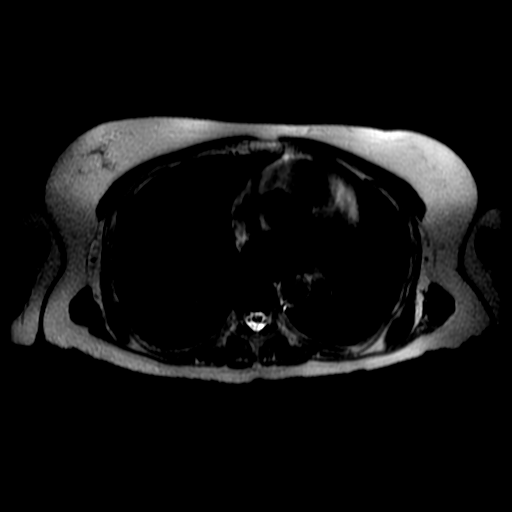

[Series 5: T2 · coronal · 5.0mm · 0.78mm/px · 2 of 39 slices shown (2 of 2)]
[im 1/39]
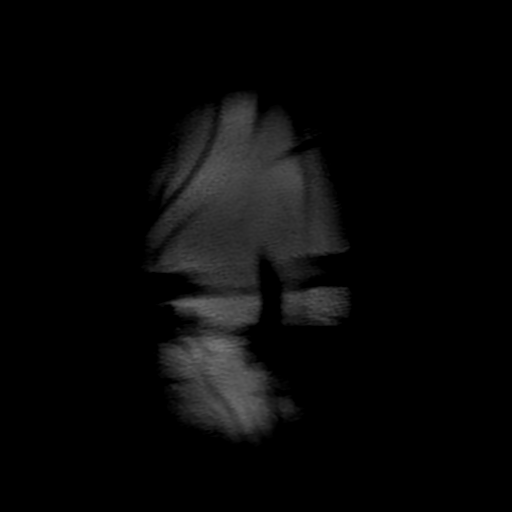
[im 39/39]
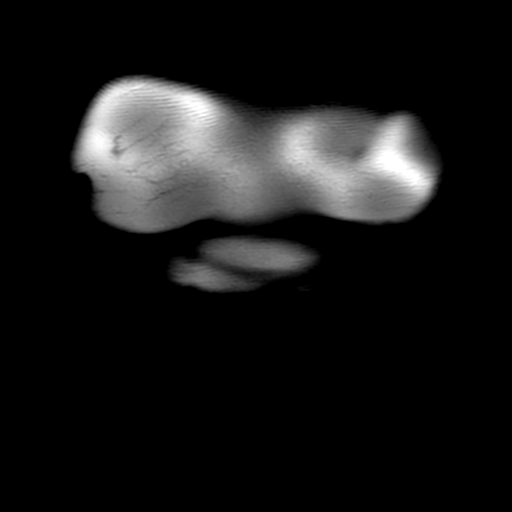

[Series 7: MRCP · coronal · 2.0mm · 0.70mm/px · 2 of 42 slices shown (1 of 2)]
[im 1/42]
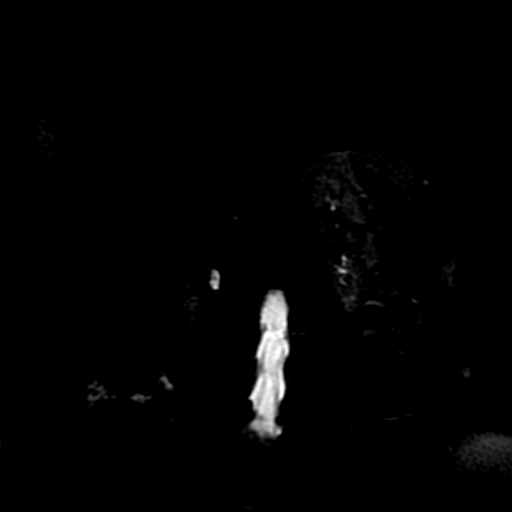
[im 42/42]
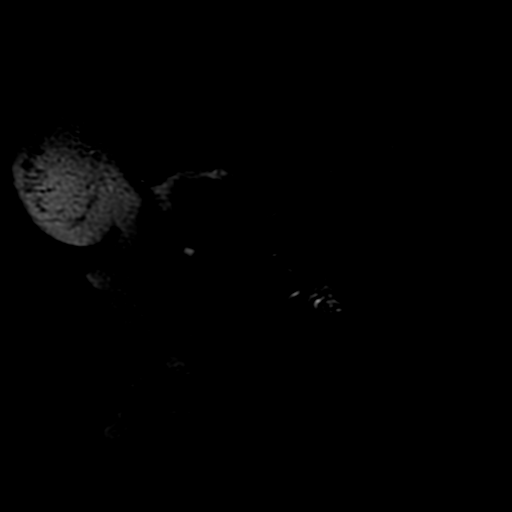

[Series 8: DWI b500 · axial · 6.0mm · 1.48mm/px · z∈[-105,+121]mm · 2 of 60 slices shown]
[im 1/60]
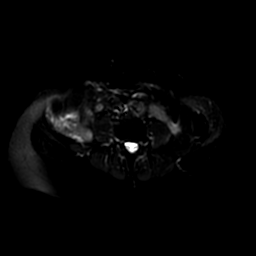
[im 60/60]
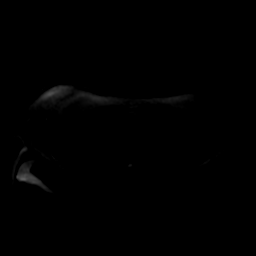

[Series 11: ax dualecho · axial · 5.0mm · 0.74mm/px · z∈[-93,+117]mm · 3 of 86 slices shown]
[im 1/86]
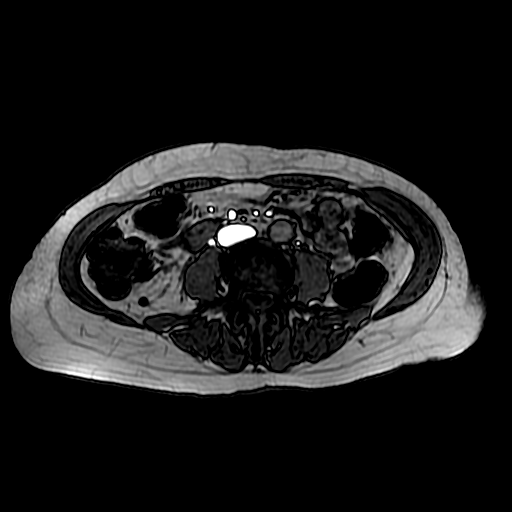
[im 43/86]
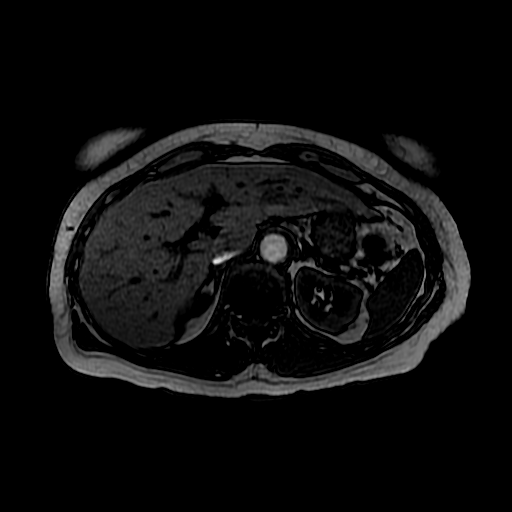
[im 86/86]
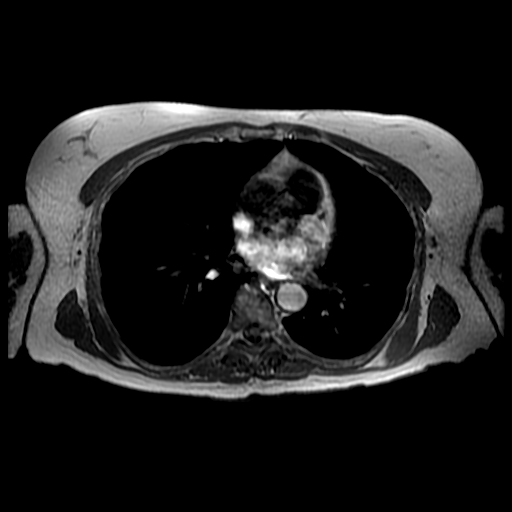

[Series 12: MRCP · oblique · 40.0mm · 0.70mm/px · 1 of 6 slices shown (2 of 2)]
[im 1/6]
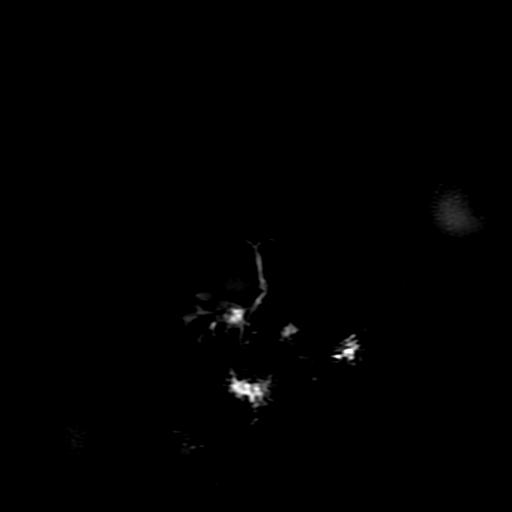

[Series 15: T1 dynamic post-contrast · coronal · 5.0mm · 0.74mm/px · 3 of 80 slices shown]
[im 1/80]
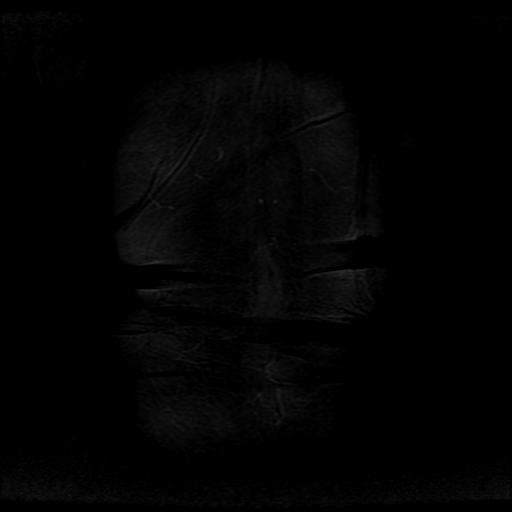
[im 40/80]
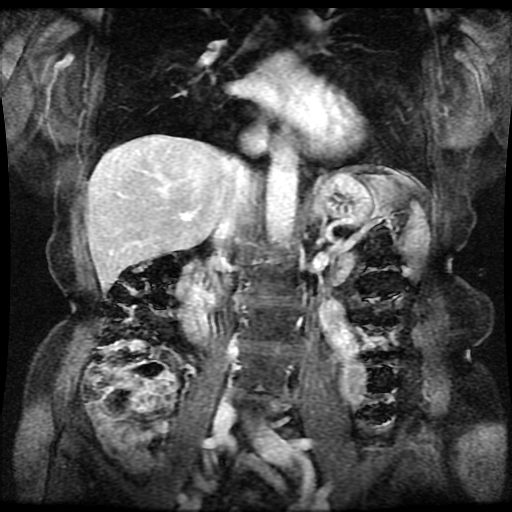
[im 80/80]
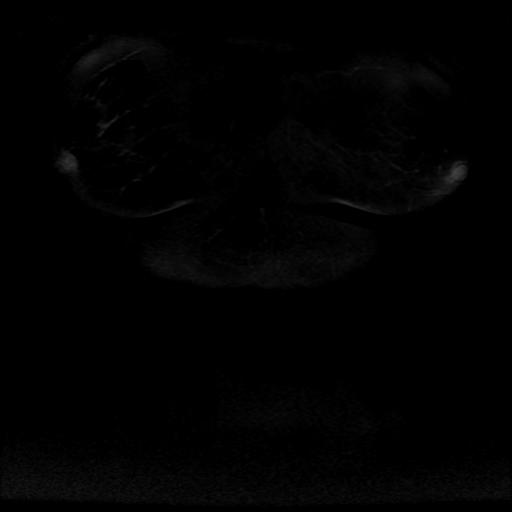

[Series 800: DWI · axial · 6.0mm · 1.48mm/px · 1 of 30 slices shown]
[im 1/30]
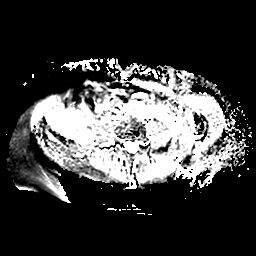

[Series 1400: T1 dynamic · axial · 5.0mm · 0.74mm/px · z∈[-93,+105]mm · 3 of 80 slices shown (1 of 2)]
[im 1/80]
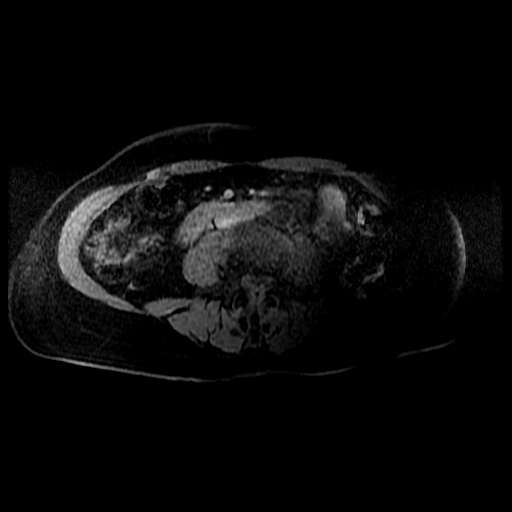
[im 40/80]
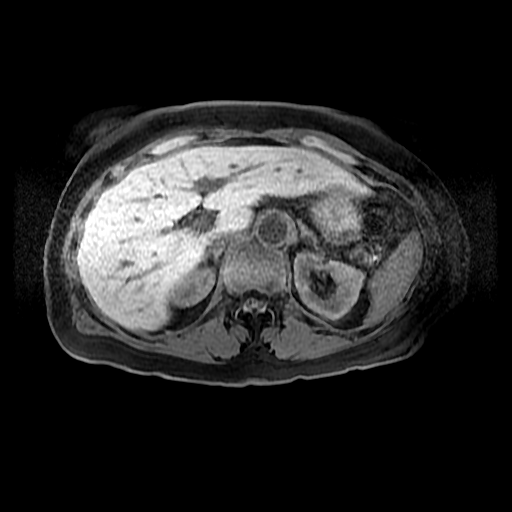
[im 80/80]
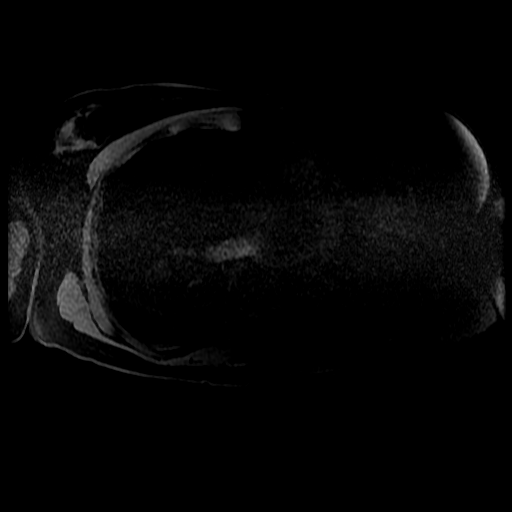

[Series 1401: T1 dynamic · axial · 5.0mm · 0.74mm/px · 1 of 80 slices shown (2 of 2)]
[im 1/80]
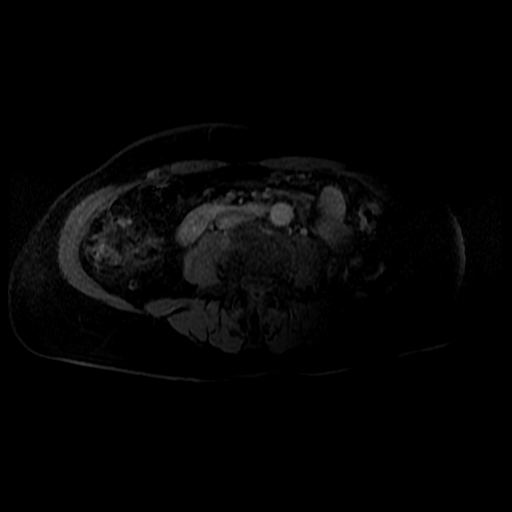

[21 of 48 positions shown; findings below may reference images not displayed]

FINDINGS: Lower chest:  Lung bases clear

Hepatobiliary: Normal liver parenchymal intensity. No hepatic
steatosis. No focal hepatic lesion. No intrahepatic biliary duct
dilatation. Gallbladder is normal. Common bile duct normal.

Postcontrast imaging demonstrates normal enhancement of liver
parenchyma. No focal lesion.

Pancreas: Normal pancreatic parenchymal intensity. No ductal
dilatation or inflammation.

Spleen: Normal spleen.

Adrenals/urinary tract: Adrenal glands and kidneys are normal.

Stomach/Bowel: Stomach and limited of the small bowel is
unremarkable

Vascular/Lymphatic: Abdominal aortic normal caliber. No
retroperitoneal periportal lymphadenopathy.

Musculoskeletal: No aggressive osseous lesion
IMPRESSION: 1. Normal liver parenchyma.
2. Normal biliary tree.
3. Normal pancreas.

## 2019-10-25 DIAGNOSIS — M0589 Other rheumatoid arthritis with rheumatoid factor of multiple sites: Secondary | ICD-10-CM | POA: Diagnosis not present

## 2019-10-25 DIAGNOSIS — Z79899 Other long term (current) drug therapy: Secondary | ICD-10-CM | POA: Diagnosis not present

## 2019-11-14 DIAGNOSIS — L814 Other melanin hyperpigmentation: Secondary | ICD-10-CM | POA: Diagnosis not present

## 2019-11-14 DIAGNOSIS — L821 Other seborrheic keratosis: Secondary | ICD-10-CM | POA: Diagnosis not present

## 2019-11-14 DIAGNOSIS — L218 Other seborrheic dermatitis: Secondary | ICD-10-CM | POA: Diagnosis not present

## 2019-11-14 DIAGNOSIS — D2262 Melanocytic nevi of left upper limb, including shoulder: Secondary | ICD-10-CM | POA: Diagnosis not present

## 2019-11-14 DIAGNOSIS — D2261 Melanocytic nevi of right upper limb, including shoulder: Secondary | ICD-10-CM | POA: Diagnosis not present

## 2019-11-22 DIAGNOSIS — M0589 Other rheumatoid arthritis with rheumatoid factor of multiple sites: Secondary | ICD-10-CM | POA: Diagnosis not present

## 2019-12-20 DIAGNOSIS — M0589 Other rheumatoid arthritis with rheumatoid factor of multiple sites: Secondary | ICD-10-CM | POA: Diagnosis not present

## 2020-01-03 DIAGNOSIS — Z23 Encounter for immunization: Secondary | ICD-10-CM | POA: Diagnosis not present

## 2020-01-19 DIAGNOSIS — M0589 Other rheumatoid arthritis with rheumatoid factor of multiple sites: Secondary | ICD-10-CM | POA: Diagnosis not present

## 2020-01-22 DIAGNOSIS — Z6823 Body mass index (BMI) 23.0-23.9, adult: Secondary | ICD-10-CM | POA: Diagnosis not present

## 2020-01-22 DIAGNOSIS — R1312 Dysphagia, oropharyngeal phase: Secondary | ICD-10-CM | POA: Diagnosis not present

## 2020-01-22 DIAGNOSIS — R7989 Other specified abnormal findings of blood chemistry: Secondary | ICD-10-CM | POA: Diagnosis not present

## 2020-01-22 DIAGNOSIS — M79671 Pain in right foot: Secondary | ICD-10-CM | POA: Diagnosis not present

## 2020-01-22 DIAGNOSIS — M0579 Rheumatoid arthritis with rheumatoid factor of multiple sites without organ or systems involvement: Secondary | ICD-10-CM | POA: Diagnosis not present

## 2020-01-22 DIAGNOSIS — M79672 Pain in left foot: Secondary | ICD-10-CM | POA: Diagnosis not present

## 2020-02-22 DIAGNOSIS — M0589 Other rheumatoid arthritis with rheumatoid factor of multiple sites: Secondary | ICD-10-CM | POA: Diagnosis not present

## 2020-03-21 DIAGNOSIS — M0589 Other rheumatoid arthritis with rheumatoid factor of multiple sites: Secondary | ICD-10-CM | POA: Diagnosis not present

## 2020-04-10 DIAGNOSIS — M79672 Pain in left foot: Secondary | ICD-10-CM | POA: Diagnosis not present

## 2020-04-10 DIAGNOSIS — R7989 Other specified abnormal findings of blood chemistry: Secondary | ICD-10-CM | POA: Diagnosis not present

## 2020-04-10 DIAGNOSIS — Z6822 Body mass index (BMI) 22.0-22.9, adult: Secondary | ICD-10-CM | POA: Diagnosis not present

## 2020-04-10 DIAGNOSIS — R1312 Dysphagia, oropharyngeal phase: Secondary | ICD-10-CM | POA: Diagnosis not present

## 2020-04-10 DIAGNOSIS — M0579 Rheumatoid arthritis with rheumatoid factor of multiple sites without organ or systems involvement: Secondary | ICD-10-CM | POA: Diagnosis not present

## 2020-04-10 DIAGNOSIS — M79671 Pain in right foot: Secondary | ICD-10-CM | POA: Diagnosis not present

## 2020-04-18 DIAGNOSIS — M0589 Other rheumatoid arthritis with rheumatoid factor of multiple sites: Secondary | ICD-10-CM | POA: Diagnosis not present

## 2020-05-16 DIAGNOSIS — M0589 Other rheumatoid arthritis with rheumatoid factor of multiple sites: Secondary | ICD-10-CM | POA: Diagnosis not present

## 2020-05-16 DIAGNOSIS — Z79899 Other long term (current) drug therapy: Secondary | ICD-10-CM | POA: Diagnosis not present

## 2020-05-16 DIAGNOSIS — Z111 Encounter for screening for respiratory tuberculosis: Secondary | ICD-10-CM | POA: Diagnosis not present

## 2020-05-16 DIAGNOSIS — R5383 Other fatigue: Secondary | ICD-10-CM | POA: Diagnosis not present

## 2020-06-12 DIAGNOSIS — M79672 Pain in left foot: Secondary | ICD-10-CM | POA: Diagnosis not present

## 2020-06-12 DIAGNOSIS — Z6823 Body mass index (BMI) 23.0-23.9, adult: Secondary | ICD-10-CM | POA: Diagnosis not present

## 2020-06-12 DIAGNOSIS — M79671 Pain in right foot: Secondary | ICD-10-CM | POA: Diagnosis not present

## 2020-06-12 DIAGNOSIS — R7989 Other specified abnormal findings of blood chemistry: Secondary | ICD-10-CM | POA: Diagnosis not present

## 2020-06-12 DIAGNOSIS — R1312 Dysphagia, oropharyngeal phase: Secondary | ICD-10-CM | POA: Diagnosis not present

## 2020-06-12 DIAGNOSIS — M0579 Rheumatoid arthritis with rheumatoid factor of multiple sites without organ or systems involvement: Secondary | ICD-10-CM | POA: Diagnosis not present

## 2020-06-13 DIAGNOSIS — M0589 Other rheumatoid arthritis with rheumatoid factor of multiple sites: Secondary | ICD-10-CM | POA: Diagnosis not present

## 2020-07-12 DIAGNOSIS — M0589 Other rheumatoid arthritis with rheumatoid factor of multiple sites: Secondary | ICD-10-CM | POA: Diagnosis not present

## 2020-08-05 DIAGNOSIS — Z23 Encounter for immunization: Secondary | ICD-10-CM | POA: Diagnosis not present

## 2020-08-12 DIAGNOSIS — M0589 Other rheumatoid arthritis with rheumatoid factor of multiple sites: Secondary | ICD-10-CM | POA: Diagnosis not present

## 2020-08-13 DIAGNOSIS — Z7952 Long term (current) use of systemic steroids: Secondary | ICD-10-CM | POA: Diagnosis not present

## 2020-08-13 DIAGNOSIS — M0579 Rheumatoid arthritis with rheumatoid factor of multiple sites without organ or systems involvement: Secondary | ICD-10-CM | POA: Diagnosis not present

## 2020-08-13 DIAGNOSIS — M79671 Pain in right foot: Secondary | ICD-10-CM | POA: Diagnosis not present

## 2020-08-13 DIAGNOSIS — R1312 Dysphagia, oropharyngeal phase: Secondary | ICD-10-CM | POA: Diagnosis not present

## 2020-08-13 DIAGNOSIS — M79672 Pain in left foot: Secondary | ICD-10-CM | POA: Diagnosis not present

## 2020-08-13 DIAGNOSIS — Z6823 Body mass index (BMI) 23.0-23.9, adult: Secondary | ICD-10-CM | POA: Diagnosis not present

## 2020-08-13 DIAGNOSIS — R7989 Other specified abnormal findings of blood chemistry: Secondary | ICD-10-CM | POA: Diagnosis not present

## 2020-08-28 DIAGNOSIS — H5213 Myopia, bilateral: Secondary | ICD-10-CM | POA: Diagnosis not present

## 2020-08-28 DIAGNOSIS — H2513 Age-related nuclear cataract, bilateral: Secondary | ICD-10-CM | POA: Diagnosis not present

## 2020-09-13 DIAGNOSIS — M0589 Other rheumatoid arthritis with rheumatoid factor of multiple sites: Secondary | ICD-10-CM | POA: Diagnosis not present

## 2020-10-14 DIAGNOSIS — M0589 Other rheumatoid arthritis with rheumatoid factor of multiple sites: Secondary | ICD-10-CM | POA: Diagnosis not present

## 2020-10-14 DIAGNOSIS — M0579 Rheumatoid arthritis with rheumatoid factor of multiple sites without organ or systems involvement: Secondary | ICD-10-CM | POA: Diagnosis not present

## 2020-11-12 DIAGNOSIS — M0589 Other rheumatoid arthritis with rheumatoid factor of multiple sites: Secondary | ICD-10-CM | POA: Diagnosis not present

## 2020-11-13 DIAGNOSIS — L820 Inflamed seborrheic keratosis: Secondary | ICD-10-CM | POA: Diagnosis not present

## 2020-11-13 DIAGNOSIS — D2261 Melanocytic nevi of right upper limb, including shoulder: Secondary | ICD-10-CM | POA: Diagnosis not present

## 2020-11-13 DIAGNOSIS — D225 Melanocytic nevi of trunk: Secondary | ICD-10-CM | POA: Diagnosis not present

## 2020-11-13 DIAGNOSIS — L812 Freckles: Secondary | ICD-10-CM | POA: Diagnosis not present

## 2020-11-13 DIAGNOSIS — L218 Other seborrheic dermatitis: Secondary | ICD-10-CM | POA: Diagnosis not present

## 2020-11-13 DIAGNOSIS — D2262 Melanocytic nevi of left upper limb, including shoulder: Secondary | ICD-10-CM | POA: Diagnosis not present

## 2020-11-13 DIAGNOSIS — L821 Other seborrheic keratosis: Secondary | ICD-10-CM | POA: Diagnosis not present

## 2020-12-05 DIAGNOSIS — H1131 Conjunctival hemorrhage, right eye: Secondary | ICD-10-CM | POA: Diagnosis not present

## 2020-12-10 DIAGNOSIS — M0589 Other rheumatoid arthritis with rheumatoid factor of multiple sites: Secondary | ICD-10-CM | POA: Diagnosis not present

## 2020-12-24 DIAGNOSIS — Z23 Encounter for immunization: Secondary | ICD-10-CM | POA: Diagnosis not present

## 2021-01-07 DIAGNOSIS — M0589 Other rheumatoid arthritis with rheumatoid factor of multiple sites: Secondary | ICD-10-CM | POA: Diagnosis not present

## 2021-01-20 DIAGNOSIS — Z23 Encounter for immunization: Secondary | ICD-10-CM | POA: Diagnosis not present

## 2021-02-05 DIAGNOSIS — M0589 Other rheumatoid arthritis with rheumatoid factor of multiple sites: Secondary | ICD-10-CM | POA: Diagnosis not present

## 2021-02-18 DIAGNOSIS — M79672 Pain in left foot: Secondary | ICD-10-CM | POA: Diagnosis not present

## 2021-02-18 DIAGNOSIS — M0579 Rheumatoid arthritis with rheumatoid factor of multiple sites without organ or systems involvement: Secondary | ICD-10-CM | POA: Diagnosis not present

## 2021-02-18 DIAGNOSIS — M79671 Pain in right foot: Secondary | ICD-10-CM | POA: Diagnosis not present

## 2021-02-18 DIAGNOSIS — R1312 Dysphagia, oropharyngeal phase: Secondary | ICD-10-CM | POA: Diagnosis not present

## 2021-02-18 DIAGNOSIS — R7989 Other specified abnormal findings of blood chemistry: Secondary | ICD-10-CM | POA: Diagnosis not present

## 2021-02-18 DIAGNOSIS — Z6823 Body mass index (BMI) 23.0-23.9, adult: Secondary | ICD-10-CM | POA: Diagnosis not present

## 2021-03-05 DIAGNOSIS — M0589 Other rheumatoid arthritis with rheumatoid factor of multiple sites: Secondary | ICD-10-CM | POA: Diagnosis not present

## 2021-04-02 DIAGNOSIS — M0589 Other rheumatoid arthritis with rheumatoid factor of multiple sites: Secondary | ICD-10-CM | POA: Diagnosis not present

## 2021-05-02 DIAGNOSIS — M0589 Other rheumatoid arthritis with rheumatoid factor of multiple sites: Secondary | ICD-10-CM | POA: Diagnosis not present

## 2021-06-03 DIAGNOSIS — R5383 Other fatigue: Secondary | ICD-10-CM | POA: Diagnosis not present

## 2021-06-03 DIAGNOSIS — M0579 Rheumatoid arthritis with rheumatoid factor of multiple sites without organ or systems involvement: Secondary | ICD-10-CM | POA: Diagnosis not present

## 2021-06-03 DIAGNOSIS — M0589 Other rheumatoid arthritis with rheumatoid factor of multiple sites: Secondary | ICD-10-CM | POA: Diagnosis not present

## 2021-06-03 DIAGNOSIS — Z79899 Other long term (current) drug therapy: Secondary | ICD-10-CM | POA: Diagnosis not present

## 2021-07-02 DIAGNOSIS — M0589 Other rheumatoid arthritis with rheumatoid factor of multiple sites: Secondary | ICD-10-CM | POA: Diagnosis not present

## 2021-08-04 DIAGNOSIS — M0589 Other rheumatoid arthritis with rheumatoid factor of multiple sites: Secondary | ICD-10-CM | POA: Diagnosis not present

## 2021-08-21 DIAGNOSIS — M79672 Pain in left foot: Secondary | ICD-10-CM | POA: Diagnosis not present

## 2021-08-21 DIAGNOSIS — R1312 Dysphagia, oropharyngeal phase: Secondary | ICD-10-CM | POA: Diagnosis not present

## 2021-08-21 DIAGNOSIS — R7989 Other specified abnormal findings of blood chemistry: Secondary | ICD-10-CM | POA: Diagnosis not present

## 2021-08-21 DIAGNOSIS — M79671 Pain in right foot: Secondary | ICD-10-CM | POA: Diagnosis not present

## 2021-08-21 DIAGNOSIS — Z6822 Body mass index (BMI) 22.0-22.9, adult: Secondary | ICD-10-CM | POA: Diagnosis not present

## 2021-08-21 DIAGNOSIS — M25512 Pain in left shoulder: Secondary | ICD-10-CM | POA: Diagnosis not present

## 2021-08-21 DIAGNOSIS — M0579 Rheumatoid arthritis with rheumatoid factor of multiple sites without organ or systems involvement: Secondary | ICD-10-CM | POA: Diagnosis not present

## 2021-09-01 DIAGNOSIS — M0579 Rheumatoid arthritis with rheumatoid factor of multiple sites without organ or systems involvement: Secondary | ICD-10-CM | POA: Diagnosis not present

## 2021-09-01 DIAGNOSIS — M0589 Other rheumatoid arthritis with rheumatoid factor of multiple sites: Secondary | ICD-10-CM | POA: Diagnosis not present

## 2021-09-03 DIAGNOSIS — H04123 Dry eye syndrome of bilateral lacrimal glands: Secondary | ICD-10-CM | POA: Diagnosis not present

## 2021-09-03 DIAGNOSIS — H2513 Age-related nuclear cataract, bilateral: Secondary | ICD-10-CM | POA: Diagnosis not present

## 2021-09-03 DIAGNOSIS — H5213 Myopia, bilateral: Secondary | ICD-10-CM | POA: Diagnosis not present

## 2021-09-29 DIAGNOSIS — M0589 Other rheumatoid arthritis with rheumatoid factor of multiple sites: Secondary | ICD-10-CM | POA: Diagnosis not present

## 2021-11-07 DIAGNOSIS — M0589 Other rheumatoid arthritis with rheumatoid factor of multiple sites: Secondary | ICD-10-CM | POA: Diagnosis not present

## 2021-12-02 DIAGNOSIS — Z23 Encounter for immunization: Secondary | ICD-10-CM | POA: Diagnosis not present

## 2021-12-08 DIAGNOSIS — M0589 Other rheumatoid arthritis with rheumatoid factor of multiple sites: Secondary | ICD-10-CM | POA: Diagnosis not present

## 2021-12-22 DIAGNOSIS — Z23 Encounter for immunization: Secondary | ICD-10-CM | POA: Diagnosis not present

## 2022-01-05 DIAGNOSIS — M47812 Spondylosis without myelopathy or radiculopathy, cervical region: Secondary | ICD-10-CM | POA: Diagnosis not present

## 2022-01-05 DIAGNOSIS — M069 Rheumatoid arthritis, unspecified: Secondary | ICD-10-CM | POA: Diagnosis not present

## 2022-01-05 DIAGNOSIS — I6523 Occlusion and stenosis of bilateral carotid arteries: Secondary | ICD-10-CM | POA: Diagnosis not present

## 2022-01-05 DIAGNOSIS — H8110 Benign paroxysmal vertigo, unspecified ear: Secondary | ICD-10-CM | POA: Diagnosis not present

## 2022-01-05 DIAGNOSIS — Z1152 Encounter for screening for COVID-19: Secondary | ICD-10-CM | POA: Diagnosis not present

## 2022-01-05 DIAGNOSIS — Z79899 Other long term (current) drug therapy: Secondary | ICD-10-CM | POA: Diagnosis not present

## 2022-01-05 DIAGNOSIS — R42 Dizziness and giddiness: Secondary | ICD-10-CM | POA: Diagnosis not present

## 2022-01-06 DIAGNOSIS — M0589 Other rheumatoid arthritis with rheumatoid factor of multiple sites: Secondary | ICD-10-CM | POA: Diagnosis not present

## 2022-02-03 DIAGNOSIS — M0589 Other rheumatoid arthritis with rheumatoid factor of multiple sites: Secondary | ICD-10-CM | POA: Diagnosis not present

## 2022-02-23 DIAGNOSIS — Z6823 Body mass index (BMI) 23.0-23.9, adult: Secondary | ICD-10-CM | POA: Diagnosis not present

## 2022-02-23 DIAGNOSIS — M25512 Pain in left shoulder: Secondary | ICD-10-CM | POA: Diagnosis not present

## 2022-02-23 DIAGNOSIS — R7989 Other specified abnormal findings of blood chemistry: Secondary | ICD-10-CM | POA: Diagnosis not present

## 2022-02-23 DIAGNOSIS — R1312 Dysphagia, oropharyngeal phase: Secondary | ICD-10-CM | POA: Diagnosis not present

## 2022-02-23 DIAGNOSIS — M0579 Rheumatoid arthritis with rheumatoid factor of multiple sites without organ or systems involvement: Secondary | ICD-10-CM | POA: Diagnosis not present

## 2022-02-23 DIAGNOSIS — M79671 Pain in right foot: Secondary | ICD-10-CM | POA: Diagnosis not present

## 2022-02-23 DIAGNOSIS — M79672 Pain in left foot: Secondary | ICD-10-CM | POA: Diagnosis not present

## 2022-02-26 DIAGNOSIS — H9193 Unspecified hearing loss, bilateral: Secondary | ICD-10-CM | POA: Diagnosis not present

## 2022-02-26 DIAGNOSIS — H8110 Benign paroxysmal vertigo, unspecified ear: Secondary | ICD-10-CM | POA: Diagnosis not present

## 2022-02-26 DIAGNOSIS — R42 Dizziness and giddiness: Secondary | ICD-10-CM | POA: Diagnosis not present

## 2022-02-26 DIAGNOSIS — H9072 Mixed conductive and sensorineural hearing loss, unilateral, left ear, with unrestricted hearing on the contralateral side: Secondary | ICD-10-CM | POA: Diagnosis not present

## 2022-03-04 DIAGNOSIS — M0589 Other rheumatoid arthritis with rheumatoid factor of multiple sites: Secondary | ICD-10-CM | POA: Diagnosis not present

## 2022-04-01 DIAGNOSIS — M0589 Other rheumatoid arthritis with rheumatoid factor of multiple sites: Secondary | ICD-10-CM | POA: Diagnosis not present

## 2022-04-29 DIAGNOSIS — M0589 Other rheumatoid arthritis with rheumatoid factor of multiple sites: Secondary | ICD-10-CM | POA: Diagnosis not present

## 2022-05-27 DIAGNOSIS — M0589 Other rheumatoid arthritis with rheumatoid factor of multiple sites: Secondary | ICD-10-CM | POA: Diagnosis not present

## 2022-06-25 DIAGNOSIS — M0589 Other rheumatoid arthritis with rheumatoid factor of multiple sites: Secondary | ICD-10-CM | POA: Diagnosis not present

## 2022-06-25 DIAGNOSIS — Z79899 Other long term (current) drug therapy: Secondary | ICD-10-CM | POA: Diagnosis not present

## 2022-06-25 DIAGNOSIS — R5383 Other fatigue: Secondary | ICD-10-CM | POA: Diagnosis not present

## 2022-06-30 DIAGNOSIS — Z79620 Long term (current) use of immunosuppressive biologic: Secondary | ICD-10-CM | POA: Diagnosis not present

## 2022-06-30 DIAGNOSIS — J22 Unspecified acute lower respiratory infection: Secondary | ICD-10-CM | POA: Diagnosis not present

## 2022-06-30 DIAGNOSIS — U071 COVID-19: Secondary | ICD-10-CM | POA: Diagnosis not present

## 2022-06-30 DIAGNOSIS — J101 Influenza due to other identified influenza virus with other respiratory manifestations: Secondary | ICD-10-CM | POA: Diagnosis not present

## 2022-07-02 DIAGNOSIS — J069 Acute upper respiratory infection, unspecified: Secondary | ICD-10-CM | POA: Diagnosis not present

## 2022-07-02 DIAGNOSIS — H8112 Benign paroxysmal vertigo, left ear: Secondary | ICD-10-CM | POA: Diagnosis not present

## 2022-07-02 DIAGNOSIS — D849 Immunodeficiency, unspecified: Secondary | ICD-10-CM | POA: Diagnosis not present

## 2022-07-02 DIAGNOSIS — M0579 Rheumatoid arthritis with rheumatoid factor of multiple sites without organ or systems involvement: Secondary | ICD-10-CM | POA: Diagnosis not present

## 2022-08-24 DIAGNOSIS — Z6823 Body mass index (BMI) 23.0-23.9, adult: Secondary | ICD-10-CM | POA: Diagnosis not present

## 2022-08-24 DIAGNOSIS — R7989 Other specified abnormal findings of blood chemistry: Secondary | ICD-10-CM | POA: Diagnosis not present

## 2022-08-24 DIAGNOSIS — M79672 Pain in left foot: Secondary | ICD-10-CM | POA: Diagnosis not present

## 2022-08-24 DIAGNOSIS — M25512 Pain in left shoulder: Secondary | ICD-10-CM | POA: Diagnosis not present

## 2022-08-24 DIAGNOSIS — M79671 Pain in right foot: Secondary | ICD-10-CM | POA: Diagnosis not present

## 2022-08-24 DIAGNOSIS — R1312 Dysphagia, oropharyngeal phase: Secondary | ICD-10-CM | POA: Diagnosis not present

## 2022-08-24 DIAGNOSIS — M0579 Rheumatoid arthritis with rheumatoid factor of multiple sites without organ or systems involvement: Secondary | ICD-10-CM | POA: Diagnosis not present

## 2022-08-25 DIAGNOSIS — M0589 Other rheumatoid arthritis with rheumatoid factor of multiple sites: Secondary | ICD-10-CM | POA: Diagnosis not present

## 2022-09-22 DIAGNOSIS — M0589 Other rheumatoid arthritis with rheumatoid factor of multiple sites: Secondary | ICD-10-CM | POA: Diagnosis not present

## 2022-10-08 DIAGNOSIS — H2513 Age-related nuclear cataract, bilateral: Secondary | ICD-10-CM | POA: Diagnosis not present

## 2022-10-08 DIAGNOSIS — H52203 Unspecified astigmatism, bilateral: Secondary | ICD-10-CM | POA: Diagnosis not present

## 2022-10-08 DIAGNOSIS — H04123 Dry eye syndrome of bilateral lacrimal glands: Secondary | ICD-10-CM | POA: Diagnosis not present

## 2022-10-21 DIAGNOSIS — M0589 Other rheumatoid arthritis with rheumatoid factor of multiple sites: Secondary | ICD-10-CM | POA: Diagnosis not present

## 2022-11-16 DIAGNOSIS — Z0000 Encounter for general adult medical examination without abnormal findings: Secondary | ICD-10-CM | POA: Diagnosis not present

## 2022-11-16 DIAGNOSIS — D849 Immunodeficiency, unspecified: Secondary | ICD-10-CM | POA: Diagnosis not present

## 2022-11-18 DIAGNOSIS — M0589 Other rheumatoid arthritis with rheumatoid factor of multiple sites: Secondary | ICD-10-CM | POA: Diagnosis not present

## 2022-11-23 ENCOUNTER — Other Ambulatory Visit (HOSPITAL_COMMUNITY): Payer: Self-pay | Admitting: Internal Medicine

## 2022-11-23 DIAGNOSIS — M0579 Rheumatoid arthritis with rheumatoid factor of multiple sites without organ or systems involvement: Secondary | ICD-10-CM | POA: Diagnosis not present

## 2022-11-23 DIAGNOSIS — Z1331 Encounter for screening for depression: Secondary | ICD-10-CM | POA: Diagnosis not present

## 2022-11-23 DIAGNOSIS — E785 Hyperlipidemia, unspecified: Secondary | ICD-10-CM | POA: Diagnosis not present

## 2022-11-23 DIAGNOSIS — H8112 Benign paroxysmal vertigo, left ear: Secondary | ICD-10-CM | POA: Diagnosis not present

## 2022-11-23 DIAGNOSIS — R1319 Other dysphagia: Secondary | ICD-10-CM

## 2022-11-23 DIAGNOSIS — R82998 Other abnormal findings in urine: Secondary | ICD-10-CM | POA: Diagnosis not present

## 2022-11-23 DIAGNOSIS — D126 Benign neoplasm of colon, unspecified: Secondary | ICD-10-CM | POA: Diagnosis not present

## 2022-11-23 DIAGNOSIS — D849 Immunodeficiency, unspecified: Secondary | ICD-10-CM | POA: Diagnosis not present

## 2022-11-23 DIAGNOSIS — Z0000 Encounter for general adult medical examination without abnormal findings: Secondary | ICD-10-CM | POA: Diagnosis not present

## 2022-11-23 DIAGNOSIS — R7301 Impaired fasting glucose: Secondary | ICD-10-CM | POA: Diagnosis not present

## 2022-11-23 DIAGNOSIS — Z1339 Encounter for screening examination for other mental health and behavioral disorders: Secondary | ICD-10-CM | POA: Diagnosis not present

## 2022-11-23 DIAGNOSIS — Z23 Encounter for immunization: Secondary | ICD-10-CM | POA: Diagnosis not present

## 2022-11-27 ENCOUNTER — Ambulatory Visit (HOSPITAL_COMMUNITY)
Admission: RE | Admit: 2022-11-27 | Discharge: 2022-11-27 | Disposition: A | Discharge status: Home / Self Care | Payer: Medicare PPO | Source: Ambulatory Visit | Attending: Internal Medicine | Admitting: Internal Medicine

## 2022-11-27 DIAGNOSIS — R1319 Other dysphagia: Secondary | ICD-10-CM | POA: Diagnosis not present

## 2022-11-27 DIAGNOSIS — R1310 Dysphagia, unspecified: Secondary | ICD-10-CM | POA: Diagnosis not present

## 2022-11-27 DIAGNOSIS — K224 Dyskinesia of esophagus: Secondary | ICD-10-CM | POA: Diagnosis not present

## 2022-12-01 ENCOUNTER — Encounter: Payer: Self-pay | Admitting: Internal Medicine

## 2022-12-03 ENCOUNTER — Telehealth: Payer: Self-pay

## 2022-12-03 ENCOUNTER — Other Ambulatory Visit: Payer: Self-pay

## 2022-12-03 DIAGNOSIS — R1319 Other dysphagia: Secondary | ICD-10-CM

## 2022-12-03 NOTE — Telephone Encounter (Signed)
-----   Message from Carie Caddy Pyrtle sent at 12/02/2022  6:09 PM EDT ----- Please see if pt can come for EGD with me in [facility] on 12/25/22 at 4 PM This would require [facility] overbook but with 3 double procedures that PM I do not see an issue with time. Abnormal barium esophagram is the diagnosis and solid food dysphagia JMP ----- Message ----- From: Cleatis Polka., MD Sent: 12/02/2022   5:30 PM EDT To: Beverley Fiedler, MD

## 2022-12-03 NOTE — Telephone Encounter (Signed)
Pt scheduled for EGD in the [facility] 12/25/22 at 4pm, pt knows to arrive at 3pm. Pt to have clear liquids all day 12/25/22 until 1pm. Pt aware, amb ref in epic.

## 2022-12-16 DIAGNOSIS — M0589 Other rheumatoid arthritis with rheumatoid factor of multiple sites: Secondary | ICD-10-CM | POA: Diagnosis not present

## 2022-12-25 ENCOUNTER — Ambulatory Visit: Payer: Medicare PPO | Admitting: Internal Medicine

## 2022-12-25 ENCOUNTER — Encounter: Payer: Self-pay | Admitting: Internal Medicine

## 2022-12-25 VITALS — BP 120/65 | HR 62 | Temp 97.9°F | Resp 13 | Ht 63.0 in | Wt 131.0 lb

## 2022-12-25 DIAGNOSIS — K219 Gastro-esophageal reflux disease without esophagitis: Secondary | ICD-10-CM

## 2022-12-25 DIAGNOSIS — K317 Polyp of stomach and duodenum: Secondary | ICD-10-CM | POA: Diagnosis not present

## 2022-12-25 DIAGNOSIS — R933 Abnormal findings on diagnostic imaging of other parts of digestive tract: Secondary | ICD-10-CM | POA: Diagnosis not present

## 2022-12-25 DIAGNOSIS — R1310 Dysphagia, unspecified: Secondary | ICD-10-CM | POA: Diagnosis not present

## 2022-12-25 DIAGNOSIS — K2282 Esophagogastric junction polyp: Secondary | ICD-10-CM | POA: Diagnosis not present

## 2022-12-25 DIAGNOSIS — M069 Rheumatoid arthritis, unspecified: Secondary | ICD-10-CM | POA: Diagnosis not present

## 2022-12-25 DIAGNOSIS — R1319 Other dysphagia: Secondary | ICD-10-CM

## 2022-12-25 DIAGNOSIS — K222 Esophageal obstruction: Secondary | ICD-10-CM

## 2022-12-25 MED ORDER — SODIUM CHLORIDE 0.9 % IV SOLN: 500.0000 mL | Freq: Once | INTRAVENOUS | Status: DC

## 2022-12-25 NOTE — Op Note (Signed)
[facility] Patient Name: Darlene Martin Procedure Date: 12/25/2022 3:08 PM MRN: [id number] Endoscopist: Beverley Fiedler , MD, [id number] Age: 72 Referring MD:  Date of Birth: 1950-05-28 Gender: Female Account #: [id number] Procedure:                Upper GI endoscopy Indications:              Dysphagia (intermittent and infrequent), Abnormal                            cine-esophagram (proximal esophageal stricture                            causing barium tablet to not pass x 15 min; distal                            non-obstructing narrowing) Medicines:                Monitored Anesthesia Care Procedure:                Pre-Anesthesia Assessment:                           - Prior to the procedure, a History and Physical                            was performed, and patient medications and                            allergies were reviewed. The patient's tolerance of                            previous anesthesia was also reviewed. The risks                            and benefits of the procedure and the sedation                            options and risks were discussed with the patient.                            All questions were answered, and informed consent                            was obtained. Prior Anticoagulants: The patient has                            taken no anticoagulant or antiplatelet agents. ASA                            Grade Assessment: II - A patient with mild systemic                            disease. After reviewing the risks and benefits,  the patient was deemed in satisfactory condition to                            undergo the procedure.                           After obtaining informed consent, the endoscope was                            passed under direct vision. Throughout the                            procedure, the patient's blood pressure, pulse, and                            oxygen saturations were  monitored continuously. The                            Olympus Scope SN [id number] was introduced through the                            mouth, and advanced to the second part of duodenum.                            The upper GI endoscopy was accomplished without                            difficulty. The patient tolerated the procedure                            well. Scope In: Scope Out: Findings:                 One benign-appearing, intrinsic moderate                            (circumferential scarring or stenosis; an endoscope                            may pass) stenosis was found 20 cm from the                            incisors. This stenosis measured 7 mm (inner                            diameter) x less than one cm (in length). The                            stenosis was traversed. Dilation was performed with                            the endoscope; site was examined and showed                            moderate mucosal disruption  and improvement in                            luminal narrowing.                           One benign-appearing, intrinsic moderate                            (circumferential scarring or stenosis; an endoscope                            may pass) stenosis was found 37 cm from the                            incisors. This stenosis measured 1.4 cm (inner                            diameter) x less than one cm (in length). The                            stenosis was traversed. A TTS dilator was passed                            through the scope. Dilation with a 15-16.5-18 mm                            balloon dilator was performed to 18 mm. The                            dilation site was examined and showed mild mucosal                            disruption.                           One 4 mm polyp was found at the gastroesophageal                            junction. The polyp was removed with a cold biopsy                            forceps.  Resection and retrieval were complete.                           A 3 cm hiatal hernia was present.                           The gastroesophageal flap valve was visualized                            endoscopically and classified as Hill Grade IV (no  fold, wide open lumen, hiatal hernia present).                           The entire examined stomach was normal.                           The examined duodenum was normal. Complications:            No immediate complications. Estimated Blood Loss:     Estimated blood loss was minimal. 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 Dilated with endoscope passage with                            improved luminal narrowing.                           - Benign-appearing esophageal stenosis at GE                            junction. Dilated to 18 mm with balloon.                           - Gastroesophageal junction polyp was found.                            Resected and retrieved.                           - 3 cm hiatal hernia.                           - Normal stomach.                           - Normal examined duodenum. Recommendation:           - Patient has a contact number available for                            emergencies. The signs and symptoms of potential                            delayed complications were discussed with the                            patient. Return to normal activities tomorrow.                            Written discharge instructions were provided to the                            patient.                           -  Advance diet as tolerated following post-dilation                            protocol.                           - Continue present medications.                           - Await pathology results.                           - Please contact me directly if you have recurrent                             issues with swallowing. Beverley Fiedler, MD 12/25/2022 3:43:27 PM This report has been signed electronically.

## 2022-12-25 NOTE — Patient Instructions (Addendum)
Advance diet as tolerated following post-dilation protocol.  Follow dilation diet! Continue present medications. Await pathology results. Please contact me directly if you have recurrent issues with swallowing.                        YOU HAD AN ENDOSCOPIC PROCEDURE TODAY AT THE [facility]:   Refer to the procedure report that was given to you for any specific questions about what was found during the examination.  If the procedure report does not answer your questions, please call your gastroenterologist to clarify.  If you requested that your care partner not be given the details of your procedure findings, then the procedure report has been included in a sealed envelope for you to review at your convenience later.  YOU SHOULD EXPECT: Some feelings of bloating in the abdomen. Passage of more gas than usual.  Walking can help get rid of the air that was put into your GI tract during the procedure and reduce the bloating. If you had a lower endoscopy (such as a colonoscopy or flexible sigmoidoscopy) you may notice spotting of blood in your stool or on the toilet paper. If you underwent a bowel prep for your procedure, you may not have a normal bowel movement for a few days.  Please Note:  You might notice some irritation and congestion in your nose or some drainage.  This is from the oxygen used during your procedure.  There is no need for concern and it should clear up in a day or so.  SYMPTOMS TO REPORT IMMEDIATELY:  Following upper endoscopy (EGD)  Vomiting of blood or coffee ground material  New chest pain or pain under the shoulder blades  Painful or persistently difficult swallowing  New shortness of breath  Fever of 100F or higher  Black, tarry-looking stools  For urgent or emergent issues, a gastroenterologist can be reached at any hour by calling [phone]. Do not use MyChart messaging for urgent concerns.    DIET:  Follow dilation diet!!  Drink plenty of fluids  but you should avoid alcoholic beverages for 24 hours.  ACTIVITY:  You should plan to take it easy for the rest of today and you should NOT DRIVE or use heavy machinery until tomorrow (because of the sedation medicines used during the test).    FOLLOW UP: Our staff will call the number listed on your records the next business day following your procedure.  We will call around 7:15- 8:00 am to check on you and address any questions or concerns that you may have regarding the information given to you following your procedure. If we do not reach you, we will leave a message.     If any biopsies were taken you will be contacted by phone or by letter within the next 1-3 weeks.  Please call us at [phone] if you have not heard about the biopsies in 3 weeks.    SIGNATURES/CONFIDENTIALITY: You and/or your care partner have signed paperwork which will be entered into your electronic medical record.  These signatures attest to the fact that that the information above on your After Visit Summary has been reviewed and is understood.  Full responsibility of the confidentiality of this discharge information lies with you and/or your care-partner.

## 2022-12-25 NOTE — Progress Notes (Unsigned)
GASTROENTEROLOGY PROCEDURE H&P NOTE   Primary Care Physician: Cleatis Polka., MD    Reason for Procedure:  Intermittent dysphagia with abnormal barium esophagram  Plan:    EGD with probable dilation  Patient is appropriate for endoscopic procedure(s) in the ambulatory ([facility]) setting.  The nature of the procedure, as well as the risks, benefits, and alternatives were carefully and thoroughly reviewed with the patient. Ample time for discussion and questions allowed. The patient understood, was satisfied, and agreed to proceed.     HPI: Darlene Martin is a 72 y.o. female who presents for EGD.  Medical history as below. No recent chest pain or shortness of breath.  No abdominal pain today.  Past Medical History:  Diagnosis Date   Cataract    Elevated LFTs    Internal hemorrhoids    Rheumatoid arthritis (HCC)    Rheumatoid factor positive    Serrated adenoma of colon     History reviewed. No pertinent surgical history.  Prior to Admission medications   Medication Sig Start Date End Date Taking? Authorizing Provider  abatacept (ORENCIA) 250 MG injection Inject into the vein every 30 (thirty) days.   Yes [provider]  Multiple Vitamin (MULTIVITAMIN) capsule Take by mouth.   Yes [provider]  clotrimazole-betamethasone (LOTRISONE) cream Apply 1 application topically 2 (two) times daily. To affected area 02/28/18   Verenis Nicosia, Carie Caddy, MD    Current Outpatient Medications  Medication Sig Dispense Refill   abatacept (ORENCIA) 250 MG injection Inject into the vein every 30 (thirty) days.     Multiple Vitamin (MULTIVITAMIN) capsule Take by mouth.     clotrimazole-betamethasone (LOTRISONE) cream Apply 1 application topically 2 (two) times daily. To affected area 30 g 0   Current Facility-Administered Medications  Medication Dose Route Frequency Provider Last Rate Last Admin   0.9 %  sodium chloride infusion  500 mL Intravenous Once Juanluis Guastella, Carie Caddy, MD         Allergies as of 12/25/2022 - Review Complete 12/25/2022  Allergen Reaction Noted   Codeine      Family History  Problem Relation Age of Onset   Polymyalgia rheumatica Mother    Osteoarthritis Mother    Heart disease Father    Cancer Maternal Grandfather     Social History   Socioeconomic History   Marital status: Married    Spouse name: Not on file   Number of children: Not on file   Years of education: Not on file   Highest education level: Not on file  Occupational History   Occupation: retired    Comment: Runner, broadcasting/film/video  Tobacco Use   Smoking status: Never   Smokeless tobacco: Never  Vaping Use   Vaping status: Never Used  Substance and Sexual Activity   Alcohol use: Yes    Comment: rare   Drug use: Never   Sexual activity: Not on file  Other Topics Concern   Not on file  Social History Narrative   Not on file   Social Determinants of Health   Financial Resource Strain: Not on file  Food Insecurity: Not on file  Transportation Needs: Not on file  Physical Activity: Not on file  Stress: Not on file  Social Connections: Not on file  Intimate Partner Violence: Not on file    Physical Exam: Vital signs in last 24 hours: @BP  113/72 (BP Location: Right Arm, Patient Position: Sitting, Cuff Size: Normal)   Pulse 76   Temp 97.9 F (36.6  C) (Temporal)   Ht 5\' 3"  (1.6 m)   Wt 131 lb (59.4 kg)   SpO2 99%   BMI 23.21 kg/m  GEN: NAD EYE: Sclerae anicteric ENT: MMM CV: Non-tachycardic Pulm: CTA b/l GI: Soft, NT/ND NEURO:  Alert & Oriented x 3   Erick Blinks, MD [facility]  12/25/2022 3:14 PM

## 2022-12-25 NOTE — Progress Notes (Unsigned)
Vitals-CW  Pt's states no medical or surgical changes since previsit or office visit. 

## 2022-12-25 NOTE — Progress Notes (Signed)
Called to room to assist during endoscopic procedure.  Patient ID and intended procedure confirmed with present staff. Received instructions for my participation in the procedure from the performing physician.  

## 2022-12-25 NOTE — Progress Notes (Unsigned)
Sedate, gd SR, tolerated procedure well, VSS, report to RN 

## 2022-12-28 ENCOUNTER — Telehealth: Payer: Self-pay | Admitting: *Deleted

## 2022-12-28 NOTE — Telephone Encounter (Signed)
  Follow up Call-     12/25/2022    2:50 PM 12/25/2022    2:45 PM  Call back number  Post procedure Call Back phone  # [phone]   Permission to leave phone message  Yes     Patient questions:  Do you have a fever, pain , or abdominal swelling? No. Pain Score  0 *  Have you tolerated food without any problems? Yes.    Have you been able to return to your normal activities? Yes.    Do you have any questions about your discharge instructions: Diet   No. Medications  No. Follow up visit  No.  Do you have questions or concerns about your Care? No.  Actions: * If pain score is 4 or above: No action needed, pain <4.

## 2022-12-30 LAB — SURGICAL PATHOLOGY

## 2023-01-04 ENCOUNTER — Encounter: Payer: Self-pay | Admitting: Internal Medicine

## 2023-01-13 DIAGNOSIS — Z79899 Other long term (current) drug therapy: Secondary | ICD-10-CM | POA: Diagnosis not present

## 2023-01-13 DIAGNOSIS — M0589 Other rheumatoid arthritis with rheumatoid factor of multiple sites: Secondary | ICD-10-CM | POA: Diagnosis not present

## 2023-01-22 ENCOUNTER — Ambulatory Visit (AMBULATORY_SURGERY_CENTER): Payer: Medicare PPO | Admitting: *Deleted

## 2023-01-22 VITALS — Ht 64.0 in | Wt 127.0 lb

## 2023-01-22 DIAGNOSIS — Z860100 Personal history of colon polyps, unspecified: Secondary | ICD-10-CM

## 2023-01-22 MED ORDER — NA SULFATE-K SULFATE-MG SULF 17.5-3.13-1.6 GM/177ML PO SOLN: 1.0000 | Freq: Once | ORAL | 0 refills | Status: AC

## 2023-01-22 MED ORDER — NA SULFATE-K SULFATE-MG SULF 17.5-3.13-1.6 GM/177ML PO SOLN: 1.0000 | Freq: Once | ORAL | 0 refills | Status: DC

## 2023-01-22 NOTE — Progress Notes (Signed)

## 2023-02-12 DIAGNOSIS — M0589 Other rheumatoid arthritis with rheumatoid factor of multiple sites: Secondary | ICD-10-CM | POA: Diagnosis not present

## 2023-02-18 ENCOUNTER — Encounter: Payer: Self-pay | Admitting: Internal Medicine

## 2023-02-19 ENCOUNTER — Encounter: Payer: Self-pay | Admitting: Internal Medicine

## 2023-02-22 ENCOUNTER — Ambulatory Visit: Payer: Medicare PPO | Admitting: Internal Medicine

## 2023-02-22 ENCOUNTER — Encounter: Payer: Self-pay | Admitting: Internal Medicine

## 2023-02-22 VITALS — BP 106/58 | HR 68 | Temp 97.9°F | Resp 11 | Ht 64.0 in | Wt 127.0 lb

## 2023-02-22 DIAGNOSIS — M069 Rheumatoid arthritis, unspecified: Secondary | ICD-10-CM | POA: Diagnosis not present

## 2023-02-22 DIAGNOSIS — Z1211 Encounter for screening for malignant neoplasm of colon: Secondary | ICD-10-CM | POA: Diagnosis not present

## 2023-02-22 DIAGNOSIS — K648 Other hemorrhoids: Secondary | ICD-10-CM

## 2023-02-22 DIAGNOSIS — Z860101 Personal history of adenomatous and serrated colon polyps: Secondary | ICD-10-CM | POA: Diagnosis not present

## 2023-02-22 DIAGNOSIS — D122 Benign neoplasm of ascending colon: Secondary | ICD-10-CM | POA: Diagnosis not present

## 2023-02-22 DIAGNOSIS — K635 Polyp of colon: Secondary | ICD-10-CM | POA: Diagnosis not present

## 2023-02-22 DIAGNOSIS — Z860100 Personal history of colon polyps, unspecified: Secondary | ICD-10-CM

## 2023-02-22 MED ORDER — SODIUM CHLORIDE 0.9 % IV SOLN: 500.0000 mL | Freq: Once | INTRAVENOUS | Status: DC

## 2023-02-22 NOTE — Progress Notes (Signed)
 Pt's states no medical or surgical changes since previsit or office visit.

## 2023-02-22 NOTE — Progress Notes (Signed)
 Called to room to assist during endoscopic procedure.  Patient ID and intended procedure confirmed with present staff. Received instructions for my participation in the procedure from the performing physician.

## 2023-02-22 NOTE — Progress Notes (Signed)
 Report to PACU, RN, vss, BBS= Clear.

## 2023-02-22 NOTE — Progress Notes (Signed)
 GASTROENTEROLOGY PROCEDURE H&P NOTE   Primary Care Physician: Loreli Elsie JONETTA Mickey., MD    Reason for Procedure:  History of sessile serrated polyp  Plan:    Colonoscopy  Patient is appropriate for endoscopic procedure(s) in the ambulatory ([facility]) setting.  The nature of the procedure, as well as the risks, benefits, and alternatives were carefully and thoroughly reviewed with the patient. Ample time for discussion and questions allowed. The patient understood, was satisfied, and agreed to proceed.     HPI: Darlene Martin is a 73 y.o. female who presents for surveillance colonoscopy.  Medical history as below.  Tolerated the prep.  No recent chest pain or shortness of breath.  No abdominal pain today.  Past Medical History:  Diagnosis Date   Cataract    Elevated LFTs    Internal hemorrhoids    Rheumatoid arthritis (HCC)    Rheumatoid factor positive    Serrated adenoma of colon     Past Surgical History:  Procedure Laterality Date   LIVER BIOPSY      Prior to Admission medications   Medication Sig Start Date End Date Taking? Authorizing Provider  Multiple Vitamin (MULTIVITAMIN) capsule Take by mouth.   Yes [provider]  abatacept  (ORENCIA ) 250 MG injection Inject into the vein every 30 (thirty) days.    [provider]  Clocortolone Pivalate (CLODERM) 0.1 % cream 1 application Externally Three times a day for 30 days 06/08/18   [provider]  clotrimazole -betamethasone  (LOTRISONE ) cream Apply 1 application topically 2 (two) times daily. To affected area 02/28/18   Trentan Trippe, Gordy CHRISTELLA, MD    Current Outpatient Medications  Medication Sig Dispense Refill   Multiple Vitamin (MULTIVITAMIN) capsule Take by mouth.     abatacept  (ORENCIA ) 250 MG injection Inject into the vein every 30 (thirty) days.     Clocortolone Pivalate (CLODERM) 0.1 % cream 1 application Externally Three times a day for 30 days     clotrimazole -betamethasone  (LOTRISONE ) cream  Apply 1 application topically 2 (two) times daily. To affected area 30 g 0   Current Facility-Administered Medications  Medication Dose Route Frequency Provider Last Rate Last Admin   0.9 %  sodium chloride  infusion  500 mL Intravenous Once Keayra Graham, Gordy CHRISTELLA, MD        Allergies as of 02/22/2023 - Review Complete 02/22/2023  Allergen Reaction Noted   Codeine      Family History  Problem Relation Age of Onset   Polymyalgia rheumatica Mother    Osteoarthritis Mother    Heart disease Father    Cancer Maternal Grandfather    Colon cancer Neg Hx    Colonic polyp Neg Hx     Social History   Socioeconomic History   Marital status: Married    Spouse name: Not on file   Number of children: Not on file   Years of education: Not on file   Highest education level: Not on file  Occupational History   Occupation: retired    Comment: runner, broadcasting/film/video  Tobacco Use   Smoking status: Never   Smokeless tobacco: Never  Vaping Use   Vaping status: Never Used  Substance and Sexual Activity   Alcohol  use: Yes    Comment: rare   Drug use: Never   Sexual activity: Not on file  Other Topics Concern   Not on file  Social History Narrative   Not on file   Social Drivers of Health   Financial Resource Strain: Not on file  Food Insecurity: Not on file  Transportation Needs: Not on file  Physical Activity: Not on file  Stress: Not on file  Social Connections: Not on file  Intimate Partner Violence: Not on file    Physical Exam: Vital signs in last 24 hours: @BP  112/71   Pulse 84   Temp 97.9 F (36.6 C) (Temporal)   Ht 5' 4 (1.626 m)   Wt 127 lb (57.6 kg)   SpO2 99%   BMI 21.80 kg/m  GEN: NAD EYE: Sclerae anicteric ENT: MMM CV: Non-tachycardic Pulm: CTA b/l GI: Soft, NT/ND NEURO:  Alert & Oriented x 3   Gordy Starch, MD [facility]  02/22/2023 11:12 AM

## 2023-02-22 NOTE — Op Note (Signed)
 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 The patient's tolerance of                            previous anesthesia was also reviewed. The risks                            and benefits of the procedure and the sedation                            options and risks were discussed with the patient.                            All questions were answered, and informed consent                            was obtained. Prior Anticoagulants: The patient has                            taken no anticoagulant or antiplatelet agents. ASA                            Grade Assessment: II - A patient with mild systemic                            disease. After reviewing the risks and benefits,  the patient was deemed in satisfactory condition to                            undergo the procedure.                           After obtaining informed consent, the colonoscope                            was passed under direct vision. Throughout the                            procedure, the patient's blood pressure, pulse, and                            oxygen saturations were monitored continuously. The                             Olympus Scope SN: [id number] was introduced through                            the anus and advanced to the terminal ileum. The                            colonoscopy was performed without difficulty. The                            patient tolerated the procedure well. The quality                            of the bowel preparation was good. The terminal                            ileum, ileocecal valve, appendiceal orifice, and                            rectum were photographed. Scope In: 11:21:39 AM Scope Out: 11:40:16 AM Scope Withdrawal Time: 0 hours 14 minutes 12 seconds  Total Procedure Duration: 0 hours 18 minutes 37 seconds  Findings:                 The digital rectal exam was normal.                           The terminal ileum appeared normal.                           Three mucous-capped and sessile polyps were found                            in the ascending colon. The polyps were 5 to 15 mm                            in size. These polyps were removed with a cold  snare. Resection and retrieval were complete.                           Internal hemorrhoids were found during                            retroflexion. The hemorrhoids were small. Complications:            No immediate complications. Estimated Blood Loss:     Estimated blood loss was minimal. Impression:               - The examined portion of the ileum was normal.                           - Three 5 to 15 mm polyps in the ascending colon (5                            mm, 10 mm and 15 mm polyps), removed with a cold                            snare. Resected and retrieved.                           - Small internal hemorrhoids. Recommendation:           - Patient has a contact number available for                            emergencies. The signs and symptoms of potential                            delayed complications were discussed with the                             patient. Return to normal activities tomorrow.                            Written discharge instructions were provided to the                            patient.                           - Resume previous diet.                           - Continue present medications.                           - Await pathology results.                           - Repeat colonoscopy is recommended for                            surveillance. The colonoscopy date will be  determined after pathology results from today's                            exam become available for review. Gordy CHRISTELLA Starch, MD 02/22/2023 11:43:08 AM This report has been signed electronically.

## 2023-02-22 NOTE — Patient Instructions (Signed)
 Thank you for letting us care for your healthcare needs today! Please see handouts regarding Polyps and Hemorrhoids.  YOU HAD AN ENDOSCOPIC PROCEDURE TODAY AT THE [facility]:   Refer to the procedure report that was given to you for any specific questions about what was found during the examination.  If the procedure report does not answer your questions, please call your gastroenterologist to clarify.  If you requested that your care partner not be given the details of your procedure findings, then the procedure report has been included in a sealed envelope for you to review at your convenience later.  YOU SHOULD EXPECT: Some feelings of bloating in the abdomen. Passage of more gas than usual.  Walking can help get rid of the air that was put into your GI tract during the procedure and reduce the bloating. If you had a lower endoscopy (such as a colonoscopy or flexible sigmoidoscopy) you may notice spotting of blood in your stool or on the toilet paper. If you underwent a bowel prep for your procedure, you may not have a normal bowel movement for a few days.  Please Note:  You might notice some irritation and congestion in your nose or some drainage.  This is from the oxygen used during your procedure.  There is no need for concern and it should clear up in a day or so.  SYMPTOMS TO REPORT IMMEDIATELY:  Following lower endoscopy (colonoscopy or flexible sigmoidoscopy):  Excessive amounts of blood in the stool  Significant tenderness or worsening of abdominal pains  Swelling of the abdomen that is new, acute  Fever of 100F or higher  For urgent or emergent issues, a gastroenterologist can be reached at any hour by calling [phone]. Do not use MyChart messaging for urgent concerns.    DIET:  We do recommend a small meal at first, but then you may proceed to your regular diet.  Drink plenty of fluids but you should avoid alcoholic beverages for 24 hours.  ACTIVITY:  You  should plan to take it easy for the rest of today and you should NOT DRIVE or use heavy machinery until tomorrow (because of the sedation medicines used during the test).    FOLLOW UP: Our staff will call the number listed on your records the next business day following your procedure.  We will call around 7:15- 8:00 am to check on you and address any questions or concerns that you may have regarding the information given to you following your procedure. If we do not reach you, we will leave a message.     If any biopsies were taken you will be contacted by phone or by letter within the next 1-3 weeks.  Please call us at [phone] if you have not heard about the biopsies in 3 weeks.    SIGNATURES/CONFIDENTIALITY: You and/or your care partner have signed paperwork which will be entered into your electronic medical record.  These signatures attest to the fact that that the information above on your After Visit Summary has been reviewed and is understood.  Full responsibility of the confidentiality of this discharge information lies with you and/or your care-partner.

## 2023-02-23 ENCOUNTER — Telehealth: Payer: Self-pay

## 2023-02-23 DIAGNOSIS — R1312 Dysphagia, oropharyngeal phase: Secondary | ICD-10-CM | POA: Diagnosis not present

## 2023-02-23 DIAGNOSIS — R7989 Other specified abnormal findings of blood chemistry: Secondary | ICD-10-CM | POA: Diagnosis not present

## 2023-02-23 DIAGNOSIS — Z6822 Body mass index (BMI) 22.0-22.9, adult: Secondary | ICD-10-CM | POA: Diagnosis not present

## 2023-02-23 DIAGNOSIS — M0579 Rheumatoid arthritis with rheumatoid factor of multiple sites without organ or systems involvement: Secondary | ICD-10-CM | POA: Diagnosis not present

## 2023-02-23 DIAGNOSIS — M79671 Pain in right foot: Secondary | ICD-10-CM | POA: Diagnosis not present

## 2023-02-23 DIAGNOSIS — M25512 Pain in left shoulder: Secondary | ICD-10-CM | POA: Diagnosis not present

## 2023-02-23 DIAGNOSIS — M79672 Pain in left foot: Secondary | ICD-10-CM | POA: Diagnosis not present

## 2023-02-23 NOTE — Telephone Encounter (Signed)
  Follow up Call-     02/22/2023   10:58 AM 12/25/2022    2:50 PM 12/25/2022    2:45 PM  Call back number  Post procedure Call Back phone  # [phone]   Permission to leave phone message Yes  Yes     Patient questions:  Do you have a fever, pain , or abdominal swelling? No. Pain Score  0 *  Have you tolerated food without any problems? Yes.    Have you been able to return to your normal activities? Yes.    Do you have any questions about your discharge instructions: Diet   No. Medications  No. Follow up visit  No.  Do you have questions or concerns about your Care? No.  Actions: * If pain score is 4 or above: No action needed, pain <4.

## 2023-02-24 ENCOUNTER — Encounter: Payer: Self-pay | Admitting: Internal Medicine

## 2023-02-24 LAB — SURGICAL PATHOLOGY

## 2023-03-12 DIAGNOSIS — M0589 Other rheumatoid arthritis with rheumatoid factor of multiple sites: Secondary | ICD-10-CM | POA: Diagnosis not present

## 2023-04-13 DIAGNOSIS — M0589 Other rheumatoid arthritis with rheumatoid factor of multiple sites: Secondary | ICD-10-CM | POA: Diagnosis not present

## 2023-05-11 DIAGNOSIS — Z79899 Other long term (current) drug therapy: Secondary | ICD-10-CM | POA: Diagnosis not present

## 2023-05-11 DIAGNOSIS — M0589 Other rheumatoid arthritis with rheumatoid factor of multiple sites: Secondary | ICD-10-CM | POA: Diagnosis not present

## 2023-05-11 DIAGNOSIS — Z111 Encounter for screening for respiratory tuberculosis: Secondary | ICD-10-CM | POA: Diagnosis not present

## 2023-05-11 DIAGNOSIS — R5383 Other fatigue: Secondary | ICD-10-CM | POA: Diagnosis not present

## 2023-06-08 DIAGNOSIS — R5383 Other fatigue: Secondary | ICD-10-CM | POA: Diagnosis not present

## 2023-06-08 DIAGNOSIS — Z79899 Other long term (current) drug therapy: Secondary | ICD-10-CM | POA: Diagnosis not present

## 2023-06-08 DIAGNOSIS — M0589 Other rheumatoid arthritis with rheumatoid factor of multiple sites: Secondary | ICD-10-CM | POA: Diagnosis not present

## 2023-07-06 DIAGNOSIS — M0589 Other rheumatoid arthritis with rheumatoid factor of multiple sites: Secondary | ICD-10-CM | POA: Diagnosis not present

## 2023-08-03 DIAGNOSIS — M0589 Other rheumatoid arthritis with rheumatoid factor of multiple sites: Secondary | ICD-10-CM | POA: Diagnosis not present

## 2023-08-31 DIAGNOSIS — M0589 Other rheumatoid arthritis with rheumatoid factor of multiple sites: Secondary | ICD-10-CM | POA: Diagnosis not present

## 2023-09-06 DIAGNOSIS — Z6822 Body mass index (BMI) 22.0-22.9, adult: Secondary | ICD-10-CM | POA: Diagnosis not present

## 2023-09-06 DIAGNOSIS — M79671 Pain in right foot: Secondary | ICD-10-CM | POA: Diagnosis not present

## 2023-09-06 DIAGNOSIS — R1312 Dysphagia, oropharyngeal phase: Secondary | ICD-10-CM | POA: Diagnosis not present

## 2023-09-06 DIAGNOSIS — M79672 Pain in left foot: Secondary | ICD-10-CM | POA: Diagnosis not present

## 2023-09-06 DIAGNOSIS — M0579 Rheumatoid arthritis with rheumatoid factor of multiple sites without organ or systems involvement: Secondary | ICD-10-CM | POA: Diagnosis not present

## 2023-09-06 DIAGNOSIS — M25512 Pain in left shoulder: Secondary | ICD-10-CM | POA: Diagnosis not present

## 2023-09-06 DIAGNOSIS — R7989 Other specified abnormal findings of blood chemistry: Secondary | ICD-10-CM | POA: Diagnosis not present

## 2023-09-28 DIAGNOSIS — M0589 Other rheumatoid arthritis with rheumatoid factor of multiple sites: Secondary | ICD-10-CM | POA: Diagnosis not present

## 2023-10-11 DIAGNOSIS — H2513 Age-related nuclear cataract, bilateral: Secondary | ICD-10-CM | POA: Diagnosis not present

## 2023-10-11 DIAGNOSIS — H5213 Myopia, bilateral: Secondary | ICD-10-CM | POA: Diagnosis not present

## 2023-10-26 DIAGNOSIS — M0589 Other rheumatoid arthritis with rheumatoid factor of multiple sites: Secondary | ICD-10-CM | POA: Diagnosis not present

## 2023-11-23 DIAGNOSIS — M0589 Other rheumatoid arthritis with rheumatoid factor of multiple sites: Secondary | ICD-10-CM | POA: Diagnosis not present

## 2023-12-06 DIAGNOSIS — D2261 Melanocytic nevi of right upper limb, including shoulder: Secondary | ICD-10-CM | POA: Diagnosis not present

## 2023-12-06 DIAGNOSIS — D225 Melanocytic nevi of trunk: Secondary | ICD-10-CM | POA: Diagnosis not present

## 2023-12-06 DIAGNOSIS — D485 Neoplasm of uncertain behavior of skin: Secondary | ICD-10-CM | POA: Diagnosis not present

## 2023-12-06 DIAGNOSIS — L812 Freckles: Secondary | ICD-10-CM | POA: Diagnosis not present

## 2023-12-06 DIAGNOSIS — L821 Other seborrheic keratosis: Secondary | ICD-10-CM | POA: Diagnosis not present

## 2023-12-06 DIAGNOSIS — D2262 Melanocytic nevi of left upper limb, including shoulder: Secondary | ICD-10-CM | POA: Diagnosis not present

## 2023-12-13 DIAGNOSIS — Z0189 Encounter for other specified special examinations: Secondary | ICD-10-CM | POA: Diagnosis not present

## 2023-12-13 DIAGNOSIS — E785 Hyperlipidemia, unspecified: Secondary | ICD-10-CM | POA: Diagnosis not present

## 2023-12-14 DIAGNOSIS — D849 Immunodeficiency, unspecified: Secondary | ICD-10-CM | POA: Diagnosis not present

## 2023-12-14 DIAGNOSIS — R7301 Impaired fasting glucose: Secondary | ICD-10-CM | POA: Diagnosis not present

## 2023-12-14 DIAGNOSIS — E785 Hyperlipidemia, unspecified: Secondary | ICD-10-CM | POA: Diagnosis not present

## 2023-12-22 DIAGNOSIS — R7301 Impaired fasting glucose: Secondary | ICD-10-CM | POA: Diagnosis not present

## 2023-12-22 DIAGNOSIS — Z0000 Encounter for general adult medical examination without abnormal findings: Secondary | ICD-10-CM | POA: Diagnosis not present

## 2023-12-22 DIAGNOSIS — M0579 Rheumatoid arthritis with rheumatoid factor of multiple sites without organ or systems involvement: Secondary | ICD-10-CM | POA: Diagnosis not present

## 2023-12-22 DIAGNOSIS — D126 Benign neoplasm of colon, unspecified: Secondary | ICD-10-CM | POA: Diagnosis not present

## 2023-12-22 DIAGNOSIS — E785 Hyperlipidemia, unspecified: Secondary | ICD-10-CM | POA: Diagnosis not present

## 2023-12-22 DIAGNOSIS — M2042 Other hammer toe(s) (acquired), left foot: Secondary | ICD-10-CM | POA: Diagnosis not present

## 2023-12-22 DIAGNOSIS — H8112 Benign paroxysmal vertigo, left ear: Secondary | ICD-10-CM | POA: Diagnosis not present

## 2023-12-22 DIAGNOSIS — D849 Immunodeficiency, unspecified: Secondary | ICD-10-CM | POA: Diagnosis not present

## 2023-12-22 DIAGNOSIS — R82998 Other abnormal findings in urine: Secondary | ICD-10-CM | POA: Diagnosis not present

## 2023-12-22 DIAGNOSIS — M0589 Other rheumatoid arthritis with rheumatoid factor of multiple sites: Secondary | ICD-10-CM | POA: Diagnosis not present
